# Patient Record
Sex: Female | Born: 1935 | Race: Black or African American | Hispanic: No | Marital: Married
Health system: Southern US, Community
[De-identification: ages and names within clinical notes are randomized; demographics above are authoritative.]

---

## 2004-03-23 ENCOUNTER — Other Ambulatory Visit: Payer: Self-pay

## 2005-08-20 ENCOUNTER — Emergency Department: Payer: Self-pay | Admitting: General Practice

## 2008-08-15 ENCOUNTER — Ambulatory Visit: Payer: Self-pay | Admitting: Internal Medicine

## 2008-09-11 ENCOUNTER — Ambulatory Visit: Payer: Self-pay | Admitting: Internal Medicine

## 2009-07-03 ENCOUNTER — Ambulatory Visit: Payer: Self-pay | Admitting: Gastroenterology

## 2009-08-21 ENCOUNTER — Inpatient Hospital Stay: Payer: Self-pay | Admitting: Internal Medicine

## 2009-09-01 ENCOUNTER — Ambulatory Visit: Payer: Self-pay | Admitting: Gastroenterology

## 2010-11-25 ENCOUNTER — Emergency Department: Payer: Self-pay | Admitting: Emergency Medicine

## 2011-01-01 ENCOUNTER — Ambulatory Visit: Payer: Self-pay | Admitting: Internal Medicine

## 2011-01-04 ENCOUNTER — Inpatient Hospital Stay: Payer: Self-pay | Admitting: Internal Medicine

## 2011-01-26 ENCOUNTER — Ambulatory Visit: Payer: Self-pay | Admitting: Internal Medicine

## 2011-01-31 ENCOUNTER — Ambulatory Visit: Payer: Self-pay | Admitting: Internal Medicine

## 2011-02-16 ENCOUNTER — Ambulatory Visit: Payer: Self-pay | Admitting: Internal Medicine

## 2011-03-03 ENCOUNTER — Ambulatory Visit: Payer: Self-pay | Admitting: Internal Medicine

## 2011-08-30 ENCOUNTER — Inpatient Hospital Stay: Payer: Self-pay | Admitting: Internal Medicine

## 2011-08-30 LAB — CK TOTAL AND CKMB (NOT AT ARMC)
CK, Total: 246 U/L — ABNORMAL HIGH (ref 21–215)
CK-MB: 6.1 ng/mL — ABNORMAL HIGH (ref 0.5–3.6)
CK-MB: 6.1 ng/mL — ABNORMAL HIGH (ref 0.5–3.6)

## 2011-08-30 LAB — TROPONIN I
Troponin-I: <0.02 ng/mL
Troponin-I: <0.02 ng/mL

## 2011-08-30 LAB — CBC
HCT: 37.8 % (ref 35.0–47.0)
HGB: 12.6 g/dL (ref 12.0–16.0)
MCV: 104 fL — ABNORMAL HIGH (ref 80–100)
Platelet: 225 10*3/uL (ref 150–440)

## 2011-08-30 LAB — COMPREHENSIVE METABOLIC PANEL
Alkaline Phosphatase: 61 U/L (ref 50–136)
Anion Gap: 9 (ref 7–16)
Bilirubin,Total: 0.6 mg/dL (ref 0.2–1.0)
Calcium, Total: 9.2 mg/dL (ref 8.5–10.1)
Chloride: 101 mmol/L (ref 98–107)
Co2: 30 mmol/L (ref 21–32)
Creatinine: 0.87 mg/dL (ref 0.60–1.30)
EGFR (Non-African Amer.): >60
Potassium: 3.9 mmol/L (ref 3.5–5.1)
SGOT(AST): 40 U/L — ABNORMAL HIGH (ref 15–37)
Total Protein: 6.8 g/dL (ref 6.4–8.2)

## 2011-08-30 LAB — RAPID INFLUENZA A&B ANTIGENS

## 2011-08-31 LAB — CBC WITH DIFFERENTIAL/PLATELET
Basophil #: 0 10*3/uL (ref 0.0–0.1)
Basophil %: 0 %
Eosinophil #: 0 10*3/uL (ref 0.0–0.7)
MCH: 34.9 pg — ABNORMAL HIGH (ref 26.0–34.0)
MCHC: 33.4 g/dL (ref 32.0–36.0)
MCV: 104 fL — ABNORMAL HIGH (ref 80–100)
Monocyte #: 0 10*3/uL (ref 0.0–0.7)
Neutrophil #: 10.3 10*3/uL — ABNORMAL HIGH (ref 1.4–6.5)
Neutrophil %: 95.3 %
RDW: 13.8 % (ref 11.5–14.5)
WBC: 10.8 10*3/uL (ref 3.6–11.0)

## 2011-08-31 LAB — TROPONIN I: Troponin-I: <0.02 ng/mL

## 2011-08-31 LAB — URINALYSIS, COMPLETE
Bacteria: NONE SEEN
Bilirubin,UR: NEGATIVE
Glucose,UR: 50 mg/dL (ref 0–75)
Ketone: NEGATIVE
Leukocyte Esterase: NEGATIVE
Nitrite: NEGATIVE
Ph: 5 (ref 4.5–8.0)
RBC,UR: 1 /HPF (ref 0–5)
Specific Gravity: 1.016 (ref 1.003–1.030)
Squamous Epithelial: NONE SEEN

## 2011-08-31 LAB — CK TOTAL AND CKMB (NOT AT ARMC): CK-MB: 6 ng/mL — ABNORMAL HIGH (ref 0.5–3.6)

## 2011-08-31 LAB — BASIC METABOLIC PANEL
BUN: 25 mg/dL — ABNORMAL HIGH (ref 7–18)
Calcium, Total: 8.3 mg/dL — ABNORMAL LOW (ref 8.5–10.1)
Co2: 25 mmol/L (ref 21–32)
EGFR (Non-African Amer.): >60
Glucose: 146 mg/dL — ABNORMAL HIGH (ref 65–99)
Potassium: 4.6 mmol/L (ref 3.5–5.1)

## 2011-09-04 LAB — CULTURE, BLOOD (SINGLE)

## 2011-09-04 LAB — CREATININE, SERUM
Creatinine: 0.94 mg/dL (ref 0.60–1.30)
EGFR (African American): >60
EGFR (Non-African Amer.): >60

## 2011-10-01 ENCOUNTER — Ambulatory Visit: Payer: Self-pay | Admitting: Internal Medicine

## 2011-10-17 ENCOUNTER — Inpatient Hospital Stay: Payer: Self-pay | Admitting: Internal Medicine

## 2011-10-17 LAB — URINALYSIS, COMPLETE
Bacteria: NONE SEEN
Ketone: NEGATIVE
Leukocyte Esterase: NEGATIVE
Ph: 5 (ref 4.5–8.0)
Protein: NEGATIVE
RBC,UR: 2 /HPF (ref 0–5)

## 2011-10-17 LAB — COMPREHENSIVE METABOLIC PANEL
Alkaline Phosphatase: 59 U/L (ref 50–136)
Anion Gap: 6 — ABNORMAL LOW (ref 7–16)
BUN: 29 mg/dL — ABNORMAL HIGH (ref 7–18)
Bilirubin,Total: 0.4 mg/dL (ref 0.2–1.0)
Chloride: 100 mmol/L (ref 98–107)
Creatinine: 0.78 mg/dL (ref 0.60–1.30)
EGFR (Non-African Amer.): >60
Potassium: 4.6 mmol/L (ref 3.5–5.1)
SGPT (ALT): 26 U/L
Total Protein: 7 g/dL (ref 6.4–8.2)

## 2011-10-17 LAB — PROTIME-INR
INR: 0.9
Prothrombin Time: 12.6 secs (ref 11.5–14.7)

## 2011-10-17 LAB — CBC WITH DIFFERENTIAL/PLATELET
Eosinophil #: 0 10*3/uL (ref 0.0–0.7)
Eosinophil %: 0.1 %
HGB: 11.6 g/dL — ABNORMAL LOW (ref 12.0–16.0)
Lymphocyte %: 12 %
MCH: 34.6 pg — ABNORMAL HIGH (ref 26.0–34.0)
MCHC: 33.7 g/dL (ref 32.0–36.0)
Monocyte #: 0.8 10*3/uL — ABNORMAL HIGH (ref 0.0–0.7)
Neutrophil %: 80.9 %
Platelet: 218 10*3/uL (ref 150–440)
RBC: 3.36 10*6/uL — ABNORMAL LOW (ref 3.80–5.20)
WBC: 11.8 10*3/uL — ABNORMAL HIGH (ref 3.6–11.0)

## 2011-10-17 LAB — CK TOTAL AND CKMB (NOT AT ARMC)
CK, Total: 58 U/L (ref 21–215)
CK-MB: 2.7 ng/mL (ref 0.5–3.6)

## 2011-10-19 LAB — CBC WITH DIFFERENTIAL/PLATELET
Basophil #: 0 10*3/uL (ref 0.0–0.1)
Eosinophil #: 0 10*3/uL (ref 0.0–0.7)
HGB: 10.5 g/dL — ABNORMAL LOW (ref 12.0–16.0)
Lymphocyte #: 0.9 10*3/uL — ABNORMAL LOW (ref 1.0–3.6)
Lymphocyte %: 6.5 %
MCHC: 33.4 g/dL (ref 32.0–36.0)
Monocyte %: 4.8 %
Neutrophil %: 88.6 %
RDW: 13.9 % (ref 11.5–14.5)
WBC: 13.4 10*3/uL — ABNORMAL HIGH (ref 3.6–11.0)

## 2011-11-01 ENCOUNTER — Ambulatory Visit: Payer: Self-pay | Admitting: Internal Medicine

## 2012-02-22 ENCOUNTER — Ambulatory Visit: Payer: Self-pay | Admitting: Vascular Surgery

## 2012-02-22 LAB — CREATININE, SERUM
Creatinine: 0.79 mg/dL (ref 0.60–1.30)
EGFR (African American): >60
EGFR (Non-African Amer.): >60

## 2012-02-22 LAB — BUN: BUN: 18 mg/dL (ref 7–18)

## 2012-05-08 IMAGING — CT CT CHEST W/ CM
1 of 2 series · 14 of 32 positions shown, 18 images · IV contrast (APPLIED)
Comparison: none

REASON FOR EXAM: sob, hypoxia, Ephrim
COMMENTS:

PROCEDURE:     CT  - CT CHEST (FOR PE) W  - January 07, 2011  [DATE]
RESULT:     Comparison: CT of the chest, abdomen, and pelvis 08/21/2009
TECHNIQUE: Multiple thin section axial images were obtained from the lung
apices to the upper abdomen following 75 ml Isovue 370 intravenous contrast,
according to the PE protocol. These images were also reviewed on a Siemens
multiplanar work station.

[Series 5: lung windows · axial · 0.60mm/px · z∈[+731,+1010]mm · 14 of 111 slices shown, 18 images]
[im 9/111  mediastinal]
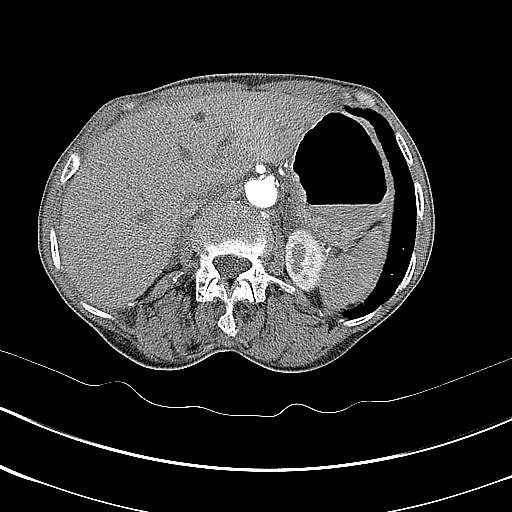
[im 9/111  lung]
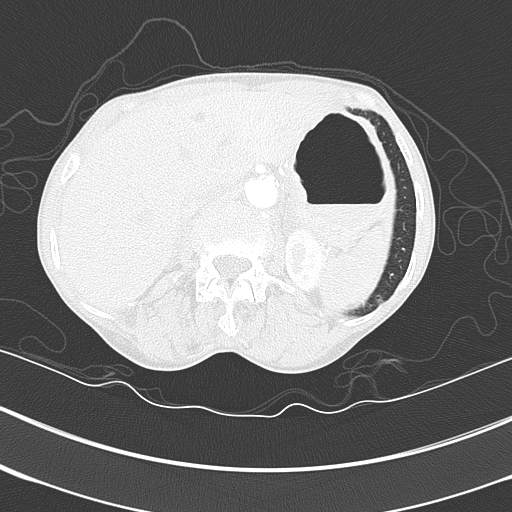
[im 17/111  lung]
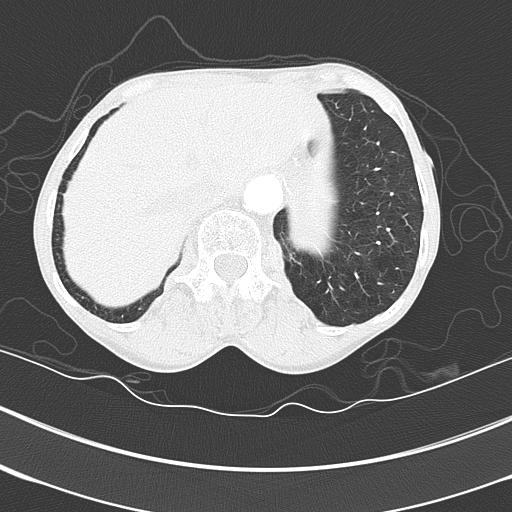
[im 26/111  lung]
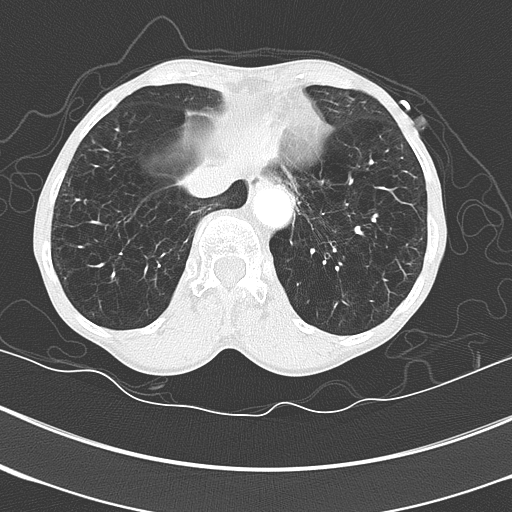
[im 34/111  lung]
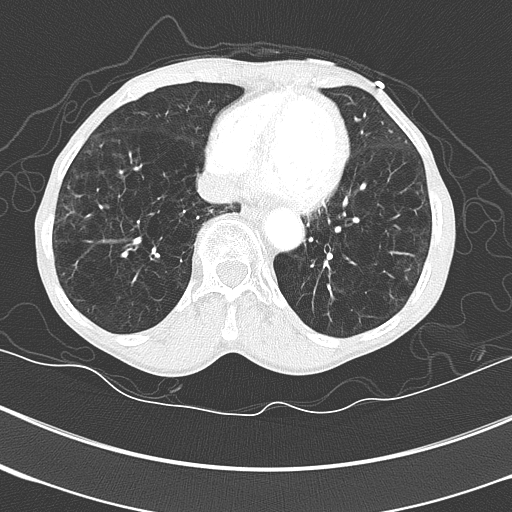
[im 43/111  mediastinal]
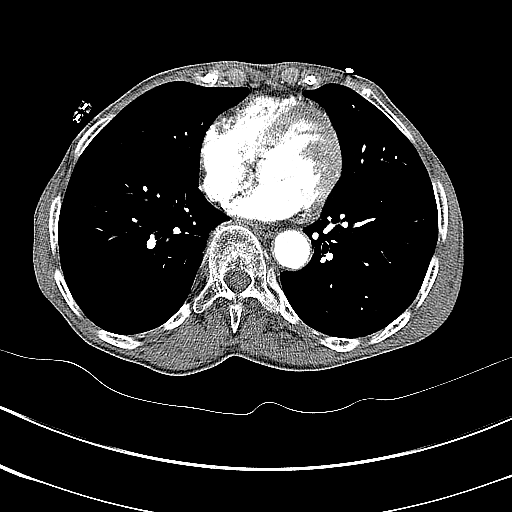
[im 43/111  lung]
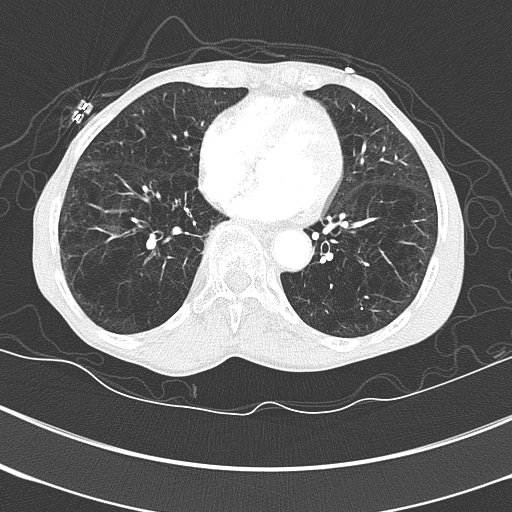
[im 51/111  lung]
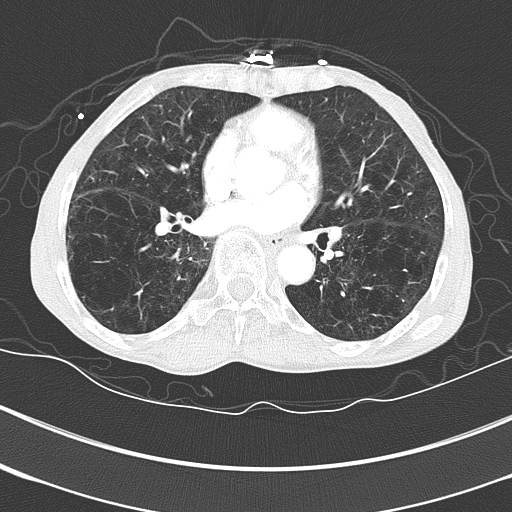
[im 52/111  lung]
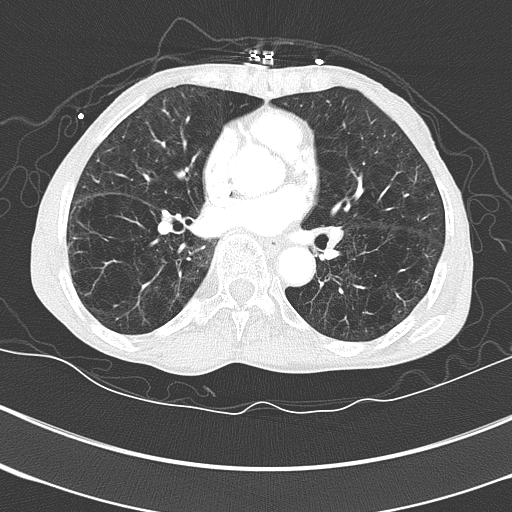
[im 56/111  lung]
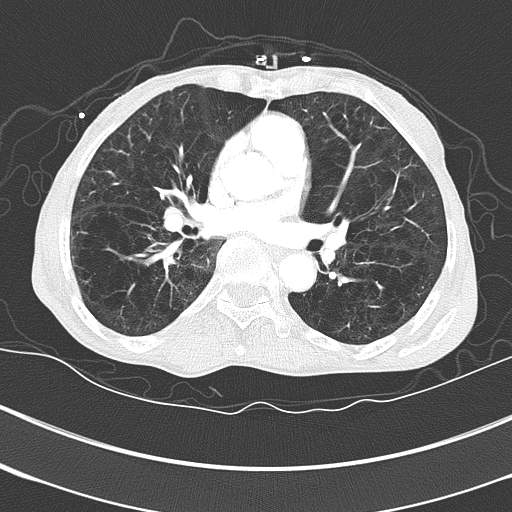
[im 60/111  mediastinal]
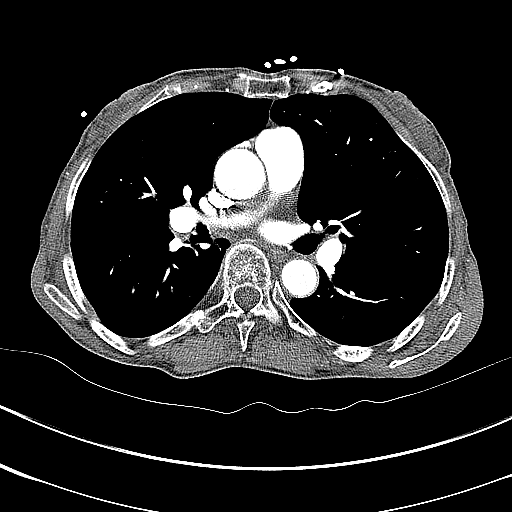
[im 60/111  lung]
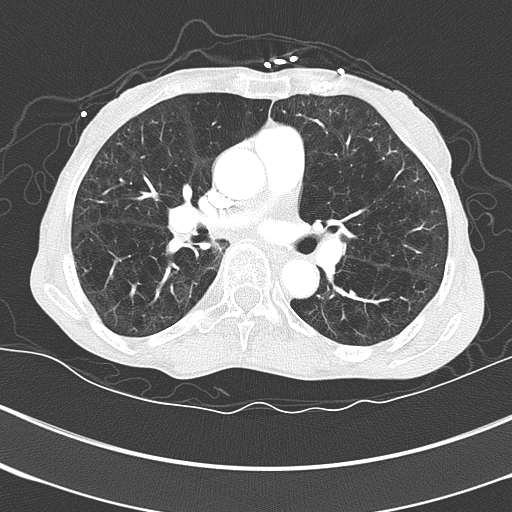
[im 68/111  lung]
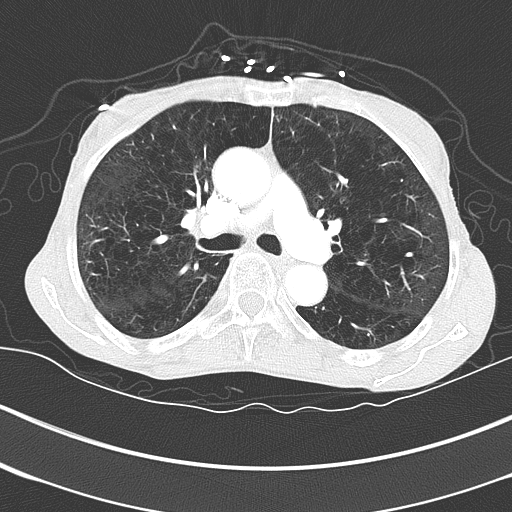
[im 77/111  lung]
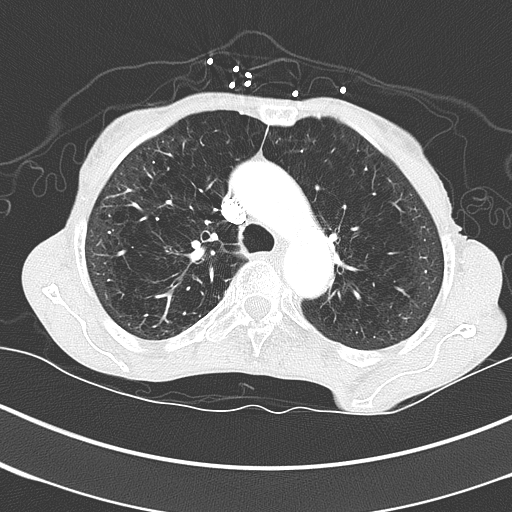
[im 85/111  lung]
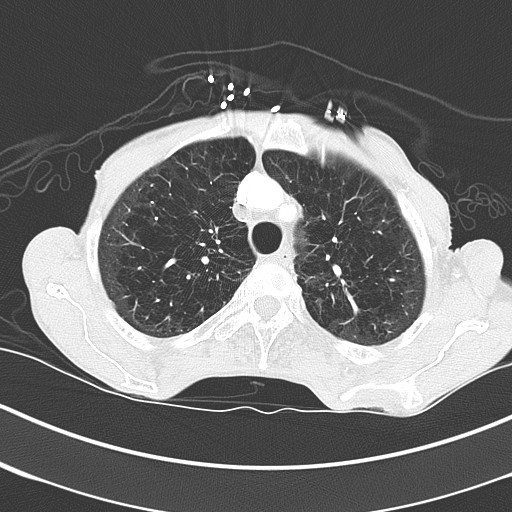
[im 94/111  mediastinal]
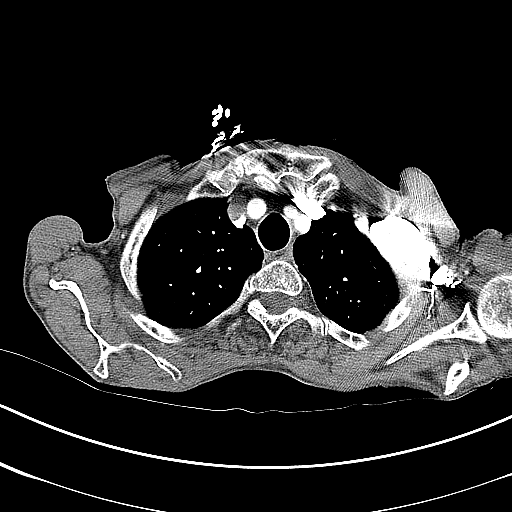
[im 94/111  lung]
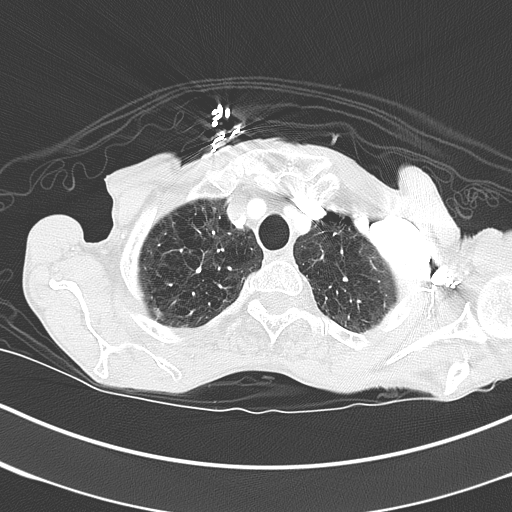
[im 102/111  lung]
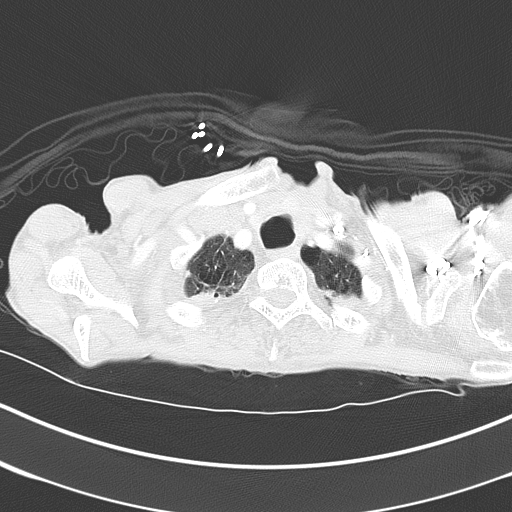

[14 of 32 positions shown; findings below may reference images not displayed]

FINDINGS: No mediastinal, hilar, or axillary lymphadenopathy. The ascending thoracic
aorta is somewhat ectatic. Small low-attenuation lesions in the liver are
too small to characterize, but likely represents cysts. These are similar to
prior. No pulmonary embolus identified.

There is moderate centrilobular emphysema. There is a nodule in the left
upper lobe which measures 1.3 x 0.8 cm. This is concerning for malignancy.
The lungs are hyperinflated.

No aggressive lytic or sclerotic osseous lesions identified.
IMPRESSION: 1. No pulmonary embolus identified.
2. Spiculated left upper lobe nodule, concerning for malignancy. Oncology
consultation is recommended.
3. Emphysema.

## 2012-09-02 ENCOUNTER — Ambulatory Visit: Payer: Self-pay | Admitting: Internal Medicine

## 2012-09-17 ENCOUNTER — Inpatient Hospital Stay: Payer: Self-pay | Admitting: Internal Medicine

## 2012-09-17 LAB — CBC WITH DIFFERENTIAL/PLATELET
Basophil #: 0.1 10*3/uL (ref 0.0–0.1)
Basophil %: 0.5 %
Eosinophil #: 0 10*3/uL (ref 0.0–0.7)
HCT: 31.9 % — ABNORMAL LOW (ref 35.0–47.0)
Lymphocyte #: 0.8 10*3/uL — ABNORMAL LOW (ref 1.0–3.6)
Lymphocyte %: 5.4 %
MCH: 32.5 pg (ref 26.0–34.0)
MCHC: 32.3 g/dL (ref 32.0–36.0)
Monocyte #: 0.7 x10 3/mm (ref 0.2–0.9)
Monocyte %: 4.7 %
Neutrophil #: 12.9 10*3/uL — ABNORMAL HIGH (ref 1.4–6.5)
Platelet: 268 10*3/uL (ref 150–440)
RBC: 3.17 10*6/uL — ABNORMAL LOW (ref 3.80–5.20)
RDW: 15.1 % — ABNORMAL HIGH (ref 11.5–14.5)

## 2012-09-17 LAB — BASIC METABOLIC PANEL
Anion Gap: 5 — ABNORMAL LOW (ref 7–16)
BUN: 27 mg/dL — ABNORMAL HIGH (ref 7–18)
Calcium, Total: 8.7 mg/dL (ref 8.5–10.1)
Chloride: 105 mmol/L (ref 98–107)
EGFR (Non-African Amer.): 47 — ABNORMAL LOW
Glucose: 129 mg/dL — ABNORMAL HIGH (ref 65–99)
Osmolality: 284 (ref 275–301)

## 2012-09-18 LAB — CBC WITH DIFFERENTIAL/PLATELET
Basophil #: 0 10*3/uL (ref 0.0–0.1)
Basophil %: 0.1 %
Eosinophil #: 0 10*3/uL (ref 0.0–0.7)
Eosinophil %: 0 %
HGB: 10.7 g/dL — ABNORMAL LOW (ref 12.0–16.0)
Lymphocyte #: 0.9 10*3/uL — ABNORMAL LOW (ref 1.0–3.6)
Lymphocyte %: 3.5 %
MCH: 32.1 pg (ref 26.0–34.0)
MCHC: 32.2 g/dL (ref 32.0–36.0)
MCV: 100 fL (ref 80–100)
Monocyte #: 0.7 x10 3/mm (ref 0.2–0.9)
Neutrophil #: 25.5 10*3/uL — ABNORMAL HIGH (ref 1.4–6.5)
WBC: 27.1 10*3/uL — ABNORMAL HIGH (ref 3.6–11.0)

## 2012-09-18 LAB — BASIC METABOLIC PANEL
BUN: 27 mg/dL — ABNORMAL HIGH (ref 7–18)
Calcium, Total: 9.4 mg/dL (ref 8.5–10.1)
Chloride: 101 mmol/L (ref 98–107)
Co2: 26 mmol/L (ref 21–32)
Creatinine: 1.14 mg/dL (ref 0.60–1.30)
EGFR (African American): 52 — ABNORMAL LOW
Glucose: 136 mg/dL — ABNORMAL HIGH (ref 65–99)
Osmolality: 283 (ref 275–301)
Potassium: 4.2 mmol/L (ref 3.5–5.1)

## 2012-09-19 LAB — CBC WITH DIFFERENTIAL/PLATELET
Basophil %: 0.1 %
Eosinophil #: 0 10*3/uL (ref 0.0–0.7)
HGB: 10.7 g/dL — ABNORMAL LOW (ref 12.0–16.0)
Lymphocyte #: 0.5 10*3/uL — ABNORMAL LOW (ref 1.0–3.6)
Lymphocyte %: 2.4 %
MCH: 32.9 pg (ref 26.0–34.0)
MCHC: 32.9 g/dL (ref 32.0–36.0)
Neutrophil #: 19.8 10*3/uL — ABNORMAL HIGH (ref 1.4–6.5)
Platelet: 268 10*3/uL (ref 150–440)
RBC: 3.25 10*6/uL — ABNORMAL LOW (ref 3.80–5.20)
RDW: 15 % — ABNORMAL HIGH (ref 11.5–14.5)
WBC: 21 10*3/uL — ABNORMAL HIGH (ref 3.6–11.0)

## 2012-09-19 LAB — BASIC METABOLIC PANEL
Anion Gap: 7 (ref 7–16)
Calcium, Total: 9.1 mg/dL (ref 8.5–10.1)
Chloride: 102 mmol/L (ref 98–107)
Creatinine: 1.53 mg/dL — ABNORMAL HIGH (ref 0.60–1.30)
Glucose: 154 mg/dL — ABNORMAL HIGH (ref 65–99)
Potassium: 4 mmol/L (ref 3.5–5.1)
Sodium: 138 mmol/L (ref 136–145)

## 2012-09-20 LAB — CBC WITH DIFFERENTIAL/PLATELET
Eosinophil #: 0 10*3/uL (ref 0.0–0.7)
HGB: 10.6 g/dL — ABNORMAL LOW (ref 12.0–16.0)
Lymphocyte #: 0.6 10*3/uL — ABNORMAL LOW (ref 1.0–3.6)
Lymphocyte %: 4.6 %
MCH: 33.4 pg (ref 26.0–34.0)
MCHC: 33.2 g/dL (ref 32.0–36.0)
MCV: 101 fL — ABNORMAL HIGH (ref 80–100)
Monocyte %: 4.1 %
Platelet: 268 10*3/uL (ref 150–440)
RBC: 3.17 10*6/uL — ABNORMAL LOW (ref 3.80–5.20)
RDW: 15.4 % — ABNORMAL HIGH (ref 11.5–14.5)
WBC: 12.6 10*3/uL — ABNORMAL HIGH (ref 3.6–11.0)

## 2012-09-20 LAB — BASIC METABOLIC PANEL
Anion Gap: 7 (ref 7–16)
BUN: 48 mg/dL — ABNORMAL HIGH (ref 7–18)
Chloride: 105 mmol/L (ref 98–107)
Co2: 29 mmol/L (ref 21–32)
Creatinine: 1.38 mg/dL — ABNORMAL HIGH (ref 0.60–1.30)
EGFR (African American): 41 — ABNORMAL LOW
Glucose: 170 mg/dL — ABNORMAL HIGH (ref 65–99)
Osmolality: 298 (ref 275–301)
Potassium: 4.6 mmol/L (ref 3.5–5.1)
Sodium: 141 mmol/L (ref 136–145)

## 2012-09-21 LAB — BASIC METABOLIC PANEL
Anion Gap: 8 (ref 7–16)
Calcium, Total: 9 mg/dL (ref 8.5–10.1)
Chloride: 106 mmol/L (ref 98–107)
Co2: 27 mmol/L (ref 21–32)
EGFR (Non-African Amer.): 43 — ABNORMAL LOW
Glucose: 176 mg/dL — ABNORMAL HIGH (ref 65–99)
Osmolality: 297 (ref 275–301)

## 2012-09-21 LAB — CBC WITH DIFFERENTIAL/PLATELET
Bands: 1 %
HCT: 30 % — ABNORMAL LOW (ref 35.0–47.0)
HGB: 9.6 g/dL — ABNORMAL LOW (ref 12.0–16.0)
Lymphocytes: 6 %
MCH: 32.2 pg (ref 26.0–34.0)
MCHC: 32.2 g/dL (ref 32.0–36.0)
Metamyelocyte: 1 %
Myelocyte: 1 %
Platelet: 259 10*3/uL (ref 150–440)
RDW: 15.1 % — ABNORMAL HIGH (ref 11.5–14.5)
Segmented Neutrophils: 87 %

## 2012-09-21 LAB — URINALYSIS, COMPLETE
Glucose,UR: NEGATIVE mg/dL (ref 0–75)
Leukocyte Esterase: NEGATIVE
Nitrite: NEGATIVE
Ph: 5 (ref 4.5–8.0)
Squamous Epithelial: 1

## 2012-09-22 LAB — CULTURE, BLOOD (SINGLE)

## 2012-09-25 LAB — CBC WITH DIFFERENTIAL/PLATELET
Bands: 3 %
Lymphocytes: 10 %
MCH: 33.2 pg (ref 26.0–34.0)
MCHC: 33.2 g/dL (ref 32.0–36.0)
MCV: 100 fL (ref 80–100)
Metamyelocyte: 2 %
Monocytes: 5 %
Myelocyte: 2 %
Platelet: 298 10*3/uL (ref 150–440)
RDW: 15.2 % — ABNORMAL HIGH (ref 11.5–14.5)
Variant Lymphocyte - H1-Rlymph: 1 %
WBC: 12.4 10*3/uL — ABNORMAL HIGH (ref 3.6–11.0)

## 2012-09-25 LAB — BASIC METABOLIC PANEL
Anion Gap: 3 — ABNORMAL LOW (ref 7–16)
BUN: 23 mg/dL — ABNORMAL HIGH (ref 7–18)
Calcium, Total: 8.9 mg/dL (ref 8.5–10.1)
Chloride: 104 mmol/L (ref 98–107)
Co2: 32 mmol/L (ref 21–32)
Glucose: 95 mg/dL (ref 65–99)

## 2012-09-30 ENCOUNTER — Ambulatory Visit: Payer: Self-pay | Admitting: Internal Medicine

## 2012-10-08 ENCOUNTER — Other Ambulatory Visit: Payer: Self-pay | Admitting: Family Medicine

## 2012-10-08 LAB — URINALYSIS, COMPLETE
Ph: 8 (ref 4.5–8.0)
Protein: NEGATIVE
RBC,UR: 11 /HPF (ref 0–5)
Specific Gravity: 1.018 (ref 1.003–1.030)
Squamous Epithelial: 1
WBC UR: 4 /HPF (ref 0–5)

## 2012-10-10 LAB — URINE CULTURE

## 2012-12-29 IMAGING — CT CT CHEST W/ CM
1 series · 15 of 33 positions shown, 19 images · IV contrast (APPLIED)
Comparison: none

REASON FOR EXAM: shortness of breath, probable lung ca with known MAYTE
mass, ?PE
COMMENTS:

PROCEDURE:     CT  - CT CHEST (FOR PE) W  - August 30, 2011  [DATE]
RESULT:     Chest CT dated 08/30/2011 comparison a made to a prior study
dated 01/07/2011
TECHNIQUE: Helical 3 mm sections were obtained the thoracic inlet the lung
bases status post intravenous administration of 80 mL Tsovue-LD5.

[Series 4: soft tissue · axial · 0.60mm/px · z∈[+60,+348]mm · 15 of 114 slices shown, 19 images]
[im 9/114  mediastinal]
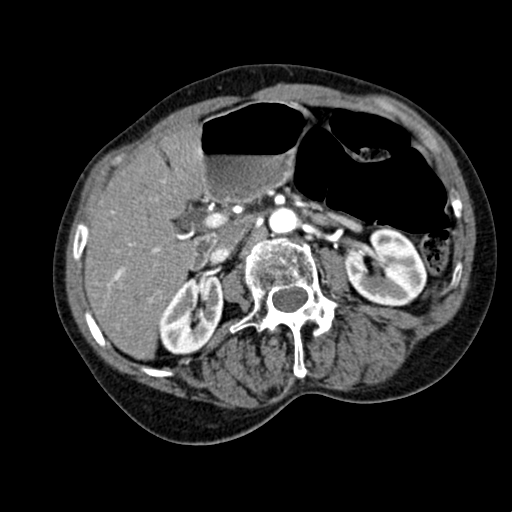
[im 9/114  lung]
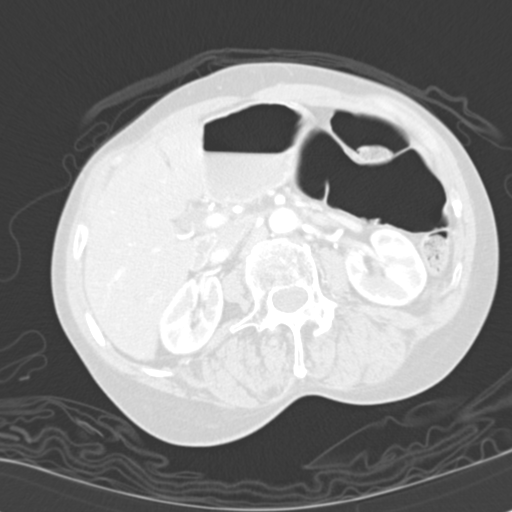
[im 17/114  lung]
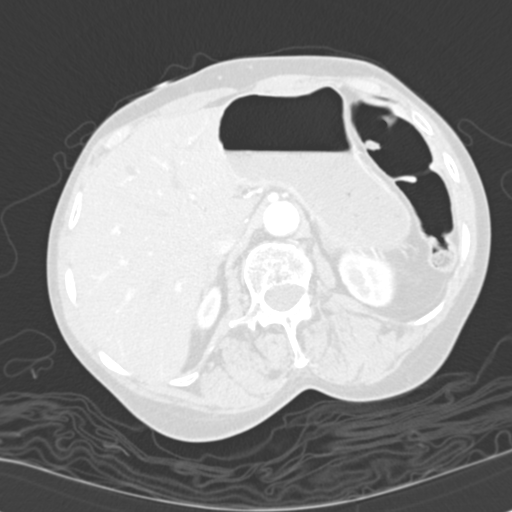
[im 23/114  lung]
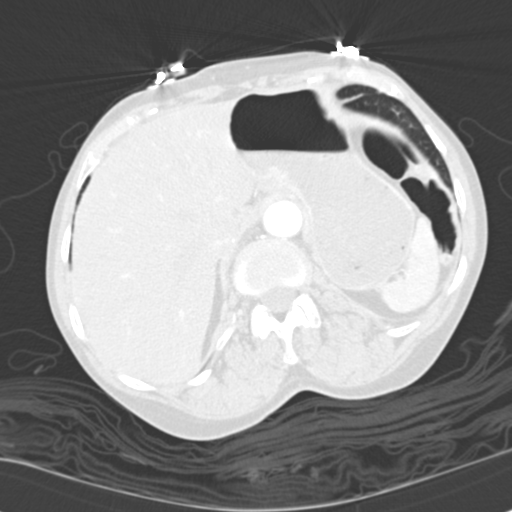
[im 30/114  lung]
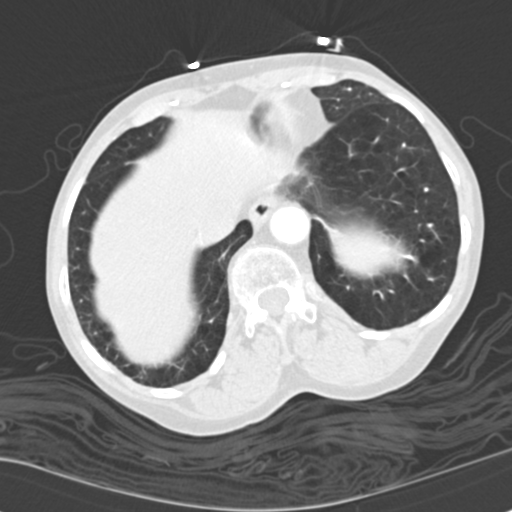
[im 38/114  mediastinal]
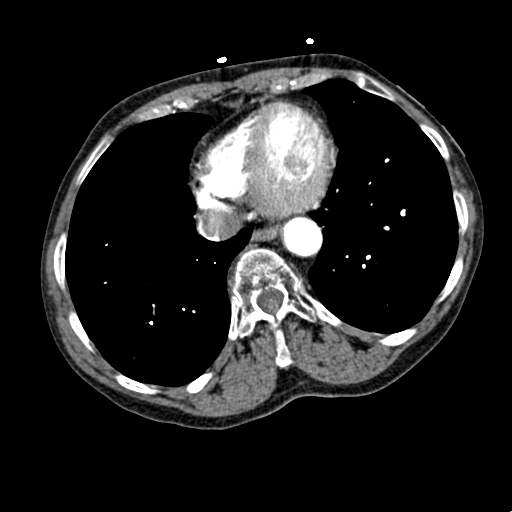
[im 38/114  lung]
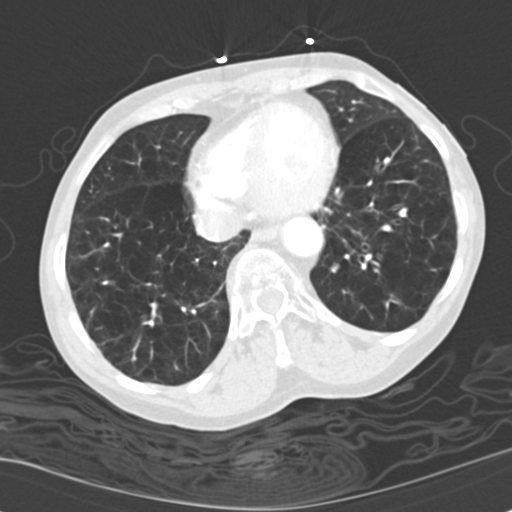
[im 46/114  lung]
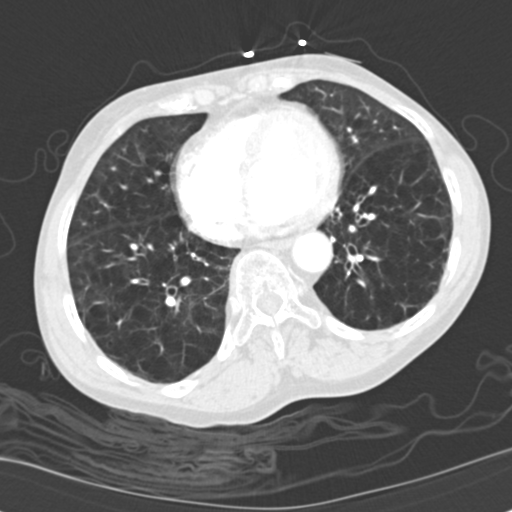
[im 51/114  lung]
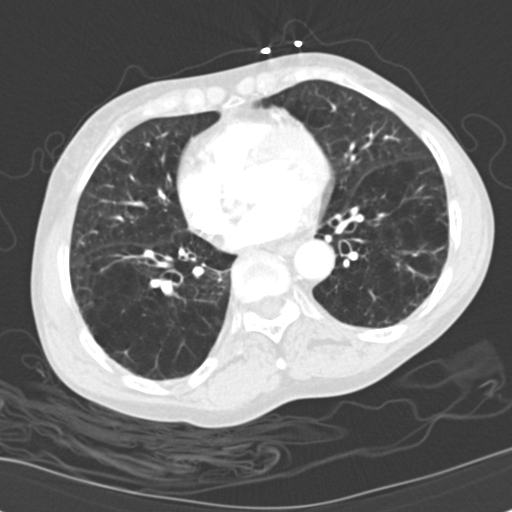
[im 59/114  lung]
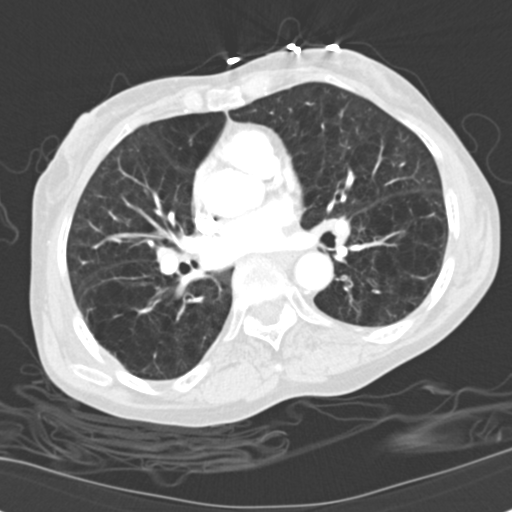
[im 63/114  mediastinal]
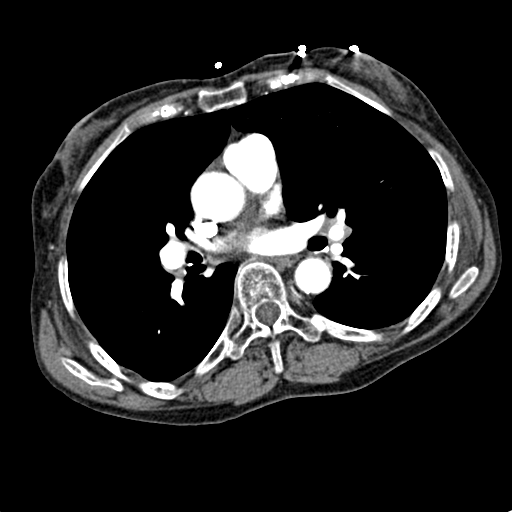
[im 63/114  lung]
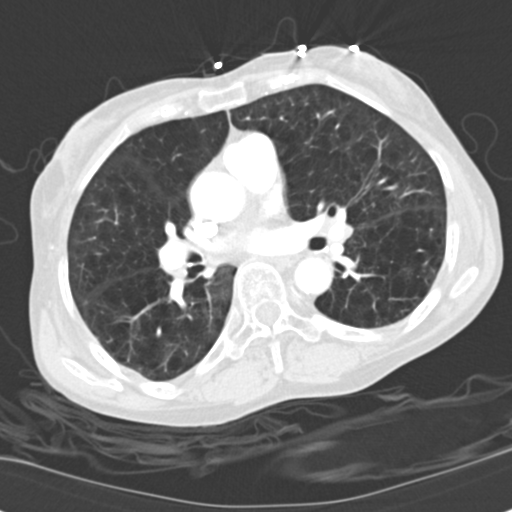
[im 68/114  lung]
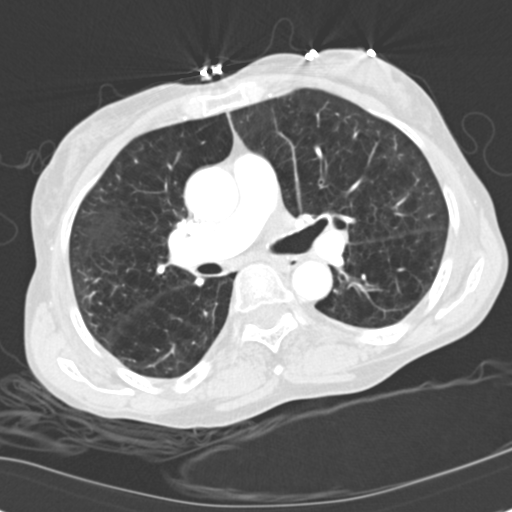
[im 76/114  lung]
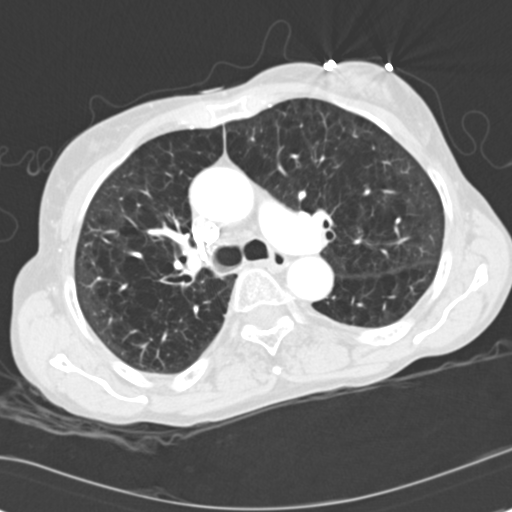
[im 84/114  lung]
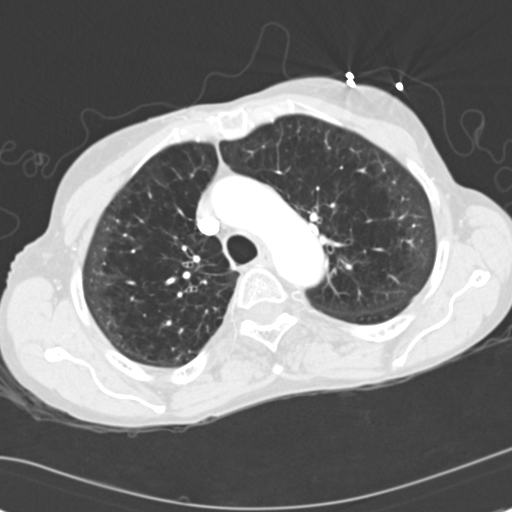
[im 91/114  mediastinal]
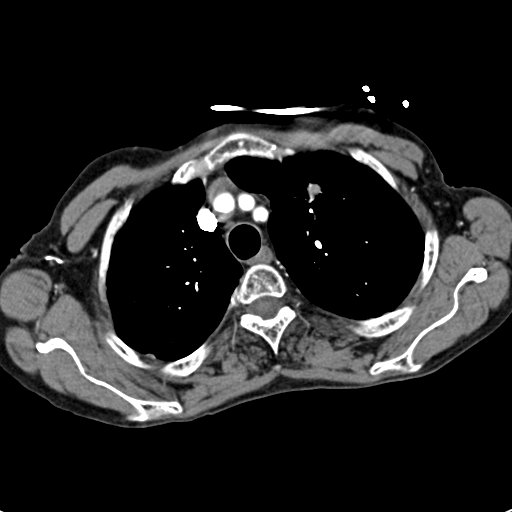
[im 91/114  lung]
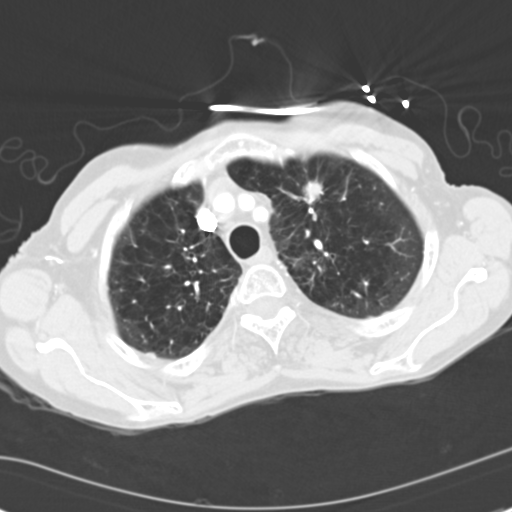
[im 97/114  lung]
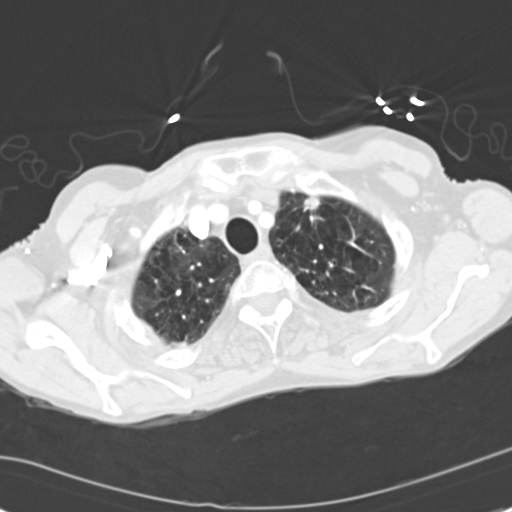
[im 105/114  lung]
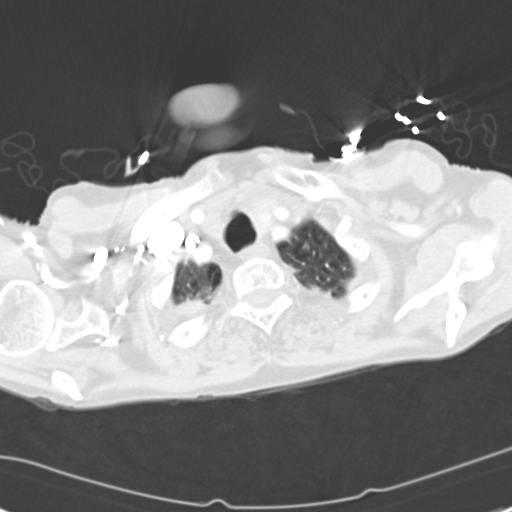

[15 of 33 positions shown; findings below may reference images not displayed]

FINDINGS: Bases of spiculated nodule within the anterior apex of the left
upper lobe has increased in size measuring 2.4 x 1.1 cm in maximal
orthogonal dimensions. This finding is again worrisome for neoplastic
disease. Diffuse severe emphysematous changes are appreciated throughout
both lungs. There is an underlying component of mild fibrotic changes.

The mediastinum and hilar regions and structures demonstrate no evidence of
mediastinal nor hilar adenopathy nor masses. There is no evidence of filling
defects within the main, lobar, or segmental only arteries.

The visualized upper abdominal viscera demonstrate no gross abnormalities.
IMPRESSION: Increased size of the left upper lobe spiculated nodule
concerning or neoplastic disease. Oncology consultation once again
recommended if not obtained in the past.
2. No CT evidence of pulmonary arterial embolic disease.
3. Severe emphysematous changes.

## 2013-11-30 DEATH — deceased

## 2014-11-19 NOTE — Op Note (Signed)
PATIENT NAME:  Crystal Sanders, Carrah M MR#:  [id number] DATE OF BIRTH:  1928-08-10  DATE OF PROCEDURE:  02/22/2012  PREOPERATIVE DIAGNOSIS: Atherosclerotic occlusive disease bilateral lower extremities with ulcerations right foot.   POSTOPERATIVE DIAGNOSIS: Atherosclerotic occlusive disease bilateral lower extremities with ulceration right foot.   PROCEDURES PERFORMED:  1. Abdominal aortogram.  2. Right lower extremity distal runoff, third order catheter placement.  3. Percutaneous transluminal angioplasty to 2 mm right posterior tibial artery.  4. Percutaneous transluminal angioplasty with stent placement to 4 mm, right SFA.  5. Percutaneous transluminal angioplasty and stent placement right external iliac artery to 6 mm.   PROCEDURE PERFORMED BY: Renford DillsGregory G. Schnier, MD    SEDATION: Versed 4 mg plus fentanyl 150 mcg administered IV. Continuous ECG, pulse oximetry, and cardiopulmonary monitoring was performed throughout the entire procedure by the interventional radiology nurse. Total sedation time was 1 hour, 50 minutes.   ACCESS: 6 French sheath left common femoral artery.   FLUORO TIME: 22.4 minutes.   CONTRAST USED: Isovue 95 mL.   INDICATIONS: Ms. Crystal Sanders is an 81 year old woman who presented to the office with ulcerations of the right foot. Physical examination as well as noninvasive studies demonstrated significant atherosclerotic occlusive disease and then arrangements were made for angiography with the hope for intervention for limb salvage. The risks and benefits have been reviewed with the patient as well as the patient's daughter. All questions were answered. The patient agrees to proceed.   PROCEDURE: The patient is taken to Special Procedures and placed in the supine position. After adequate sedation is achieved, both groins are prepped and draped in a sterile fashion. Ultrasound is placed in a sterile sleeve. Ultrasound is utilized secondary to lack of appropriate landmarks. Under  direct ultrasound visualization, the common femoral artery is identified. It is echolucent and pulsatile indicating patency. Image is recorded for the permanent record. With real-time visualization, microneedle was inserted into the anterior wall of the common femoral artery, microwire followed by micro sheath, J-wire followed by 5 French sheath, 5 French pigtail catheter. Pigtail catheter is positioned at the level of T12 and AP projection of the aorta is obtained. Pigtail catheter is then repositioned and bilateral oblique views of the pelvis are obtained. A stiff angled Glidewire and pigtail catheter are then used to cross the aortic bifurcation and the catheter wire combination is negotiated into the proximal SFA. Distal runoff is obtained. This represents third order catheter placement.   Using a combination of a straight glide catheter and the Glidewire, the multiple subtotal occlusive lesions are crossed. At one point the catheter would not cross and a 3 x 6 mm balloon was used to pre-dilate the SFA. This allowed the catheter to track down into the popliteal where hand injection of contrast was again utilized to demonstrate distal runoff. Ultimately, single vessel runoff via the posterior tibial is noted. There is a high-grade stricture stenosis at the origin. There is a distal lesion near the ankle. Ultimately the catheter using a combination of different wires including Platinum Plus, grand slam V18, and several different catheters including the straight glide catheter as well as CXI catheter. The distal lesion was not able to be crossed, however, the proximal lesion was and this was treated with balloon dilatation to 10 atmospheres using a 2 mm diameter balloon. Attention was then turned to the SFA where 4 mm diameter balloon was utilized to dilate the SFA. Follow-up imaging demonstrated greater than 75% lesion at Hunter's canal and, therefore, a 6 x  170 LifeStent was deployed across this area and then  post dilated with the 4 mm balloon. Follow-up angiography demonstrated excellent result with preservation of single vessel runoff. The LAO projection of the external iliac was then obtained, the previously noted lesion localized. Diameter of the iliac artery was measured to be approximately 6 mm and, therefore, a 7 x 40 Life Star stent was deployed across this lesion and postdilated to 6 mm. Follow-up angiography demonstrated an excellent result with rapid flow of contrast into the femoral system. Sheath was then pulled, iliac groin image obtained in the oblique projection and a Mynx device deployed without difficulty. There were no immediate complications.   INTERPRETATION: Initial views of the aorta demonstrate the aorta is widely patent. There is a high-grade stenosis approximately 70% in the right external iliac lesion. Common femoral and profunda femoris are patent. Superficial femoral artery is patent in its proximal one-third but then demonstrates multiple subtotal occlusions throughout its course down and into Hunter's canal. Mid and distal popliteal is widely patent. Trifurcation has heavy disease with only single vessel runoff via the posterior tibial. This demonstrates a greater than 90% stenosis at its origin. There is also approximately a 60 to 70% stenosis down by the ankle. This lesion down by the ankle was not able to be crossed but the proximal lesion was and this was treated with 2 mm inflation with an excellent result and less than 5% residual stenosis. The SFA lesion was treated to 4 mm but did require stent secondary to a significant flow-limiting dissection. Iliac lesion was stented and postdilated to 6 mm.   SUMMARY: Successful recanalization for limb salvage right lower extremity multilevel disease including the external iliac, the SFA, and the tibials.   ____________________________ Renford Dills, MD ggs:drc D: 02/22/2012 10:03:55 ET T: 02/22/2012 11:15:35  ET JOB#: [id number]  cc: Renford Dills, MD, <Dictator> Renford Dills MD ELECTRONICALLY SIGNED 02/24/2012 12:26

## 2014-11-22 NOTE — Consult Note (Signed)
   Comments   Ginny Ward, hospice liaison, and I met with pt's 2 daughter (including pt's 55), son, daughter-in-law, and son-in-law regarding pt's medical care. They confirm that patient has been a resident of [facility] with hospice following. Overall pt's medical status has been stable until rapid decline over past several weeks with increasing shortness of breath requiring frequent comfort meds. Family are in agreement with current medical plan including IV abx and want to give patient time to see if her clinical status improves. Overall they recognize that pt has been declining and may be approaching end of life (with or without treatment). We talked about option of the Hospice Home if it becomes apparent that patient is not improving and they would likely agree with this. They do NOT want pt to return to [facility] and ask that she be found an alternative placement. Will ask CM to assist.  add po morphine as this has been efficacious at SNF. Pt has been requiring dosing about every 2 hours. Will follow.  30 minutes  Electronic Signatures: Josuha Fontanez, Kirt Boys (NP)  (Signed 17-Feb-14 11:16)  Authored: Palliative Care Phifer, Izora Gala (MD)  (Signed 17-Feb-14 20:58)  Authored: Palliative Care   Last Updated: 17-Feb-14 20:58 by Phifer, Izora Gala (MD)

## 2014-11-22 NOTE — Discharge Summary (Signed)
PATIENT NAME:  Crystal Sanders, Crystal Sanders MR#:  [id number] DATE OF BIRTH:  1928-08-19  DATE OF ADMISSION:  09/17/2012 DATE OF DISCHARGE:  09/25/2012  PRIMARY CARE PHYSICIAN: Toy Cookey, MD  FINAL DIAGNOSES: 1.  Acute on chronic respiratory failure.  2.  Systemic inflammatory response syndrome.  3.  Thrush.  4.  Chronic systolic congestive heart failure.  5.  Acute kidney injury 6.  Left upper lobe mass, likely lung cancer.  7.  Constipation.  8.  Failure to thrive.   DISCHARGE MEDICATIONS: 1.  Advair Diskus 250/50 one inhalation twice a day. 2.  Protonix 40 mg daily. 3.  Aspirin 81 mg daily. 4.  Remeron 7.5 mg at bedtime. 5.  Coreg 3.125 mg twice a day. 6.  Roflumilast 500 mcg daily.  7.  DuoNeb 3 mL inhalation nebulizer 4 times daily.  8.  Senna S 50/8.6 two tablets at bedtime. 9.  MiraLax 17 grams daily. 10.   Latanoprost 0.005% ophthalmic solution 1 drop affected eye at bedtime.  11.  Tylenol 650 mg every 6 hours as needed. 12.  Morphine 20 mg/mL 10 mg orally every 2 hours as needed for pain or shortness of breath. 13.  Xanax 0.25 mg every 4 hours as needed for anxiety.  14.  Decadron 2 mg daily. 15.  Fluconazole 100 mg daily for 7 days then stop. 16.  Levaquin 500 mg for 2 more days then stop. 17.  Ensure 240 mL 3 times a day.  OXYGEN:  2 liters nasal cannula.   DIET: Low sodium diet, regular consistency.   ACTIVITY: As tolerated.  DISCHARGE INSTRUCTIONS:  Follow up in 1 to 2 days with doctor at nursing home facility.   REASON FOR ADMISSION: The patient was admitted February 16th. Came in with shortness of breath, hypoxia, fever and cough. The patient was admitted initially for possibility of healthcare-associated pneumonia, postobstructive pneumonia, was started on Zosyn, Levaquin and vancomycin, and Solu-Medrol.   LABORATORY AND RADIOLOGICAL DATA DURING HOSPITAL COURSE: Included a normal sinus rhythm, incomplete left bundle branch block. Blood cultures were negative. Influenza  A and B were negative. Chest x-ray was read as increased size of the patient's known left upper lobe spiculated nodule. No evidence of acute cardiopulmonary disease. White count 14.5, H and H 10.3 and 31.9, and platelet count of 268. Glucose 129, BUN 27, creatinine 1.1, sodium 139, potassium 4.8, chloride 105, CO2 27, calcium 8.7. Creatinine did go up to 1.53. Vitamin B12 greater than 1000. Repeat chest x-ray on the 20th showed left upper lobe mass. Upon discharge labs: White blood cell count 12.4, H and H 10.7 and 32.3, and platelet count of 298. Glucose 95, BUN 23, creatinine 0.65, sodium 139, potassium 4.1, chloride 104 and CO2 32.   HOSPITAL COURSE PER PROBLEM LIST:  1.  For the patient's acute on chronic respiratory failure, I think this is secondary to COPD exacerbation. The chest x-ray did not show a pneumonia. The patient was initially started on aggressive antibiotics and cut back to Levaquin. Her vital signs are stable. She will complete a course of Levaquin, 2 more days. She is chronically on Decadron. She does have COPD and on inhalers and nebulizers.  2.  For her systemic inflammatory response syndrome, that had improved. The vital signs are better. Upon discharge temperature 97.8, pulse 85, pulse oximetry 100% on 2 liters and respirations 20. 3.  For her thrush, this may be the cause of her poor appetite. She is on Diflucan.  4.  For  her chronic systolic congestive heart failure, I do not feel Lasix is indicated in this patient. This may dehydrate her more, but can use on a p.r.n. basis if you needed to. I did not prescribe an ACE inhibitor because her creatinine did go up and her blood pressure is on the lower side just with the Coreg.  5.  For her acute kidney injury, this improved with IV fluid hydration.  6.  For her left upper lung mass, this is likely lung cancer. It did enlarge on chest x-ray. They refused treatment in the past. This will likely be fatal.  7.  Constipation. She is on  medications.  8.  Failure to thrive. The patient did have a calorie count which was poor but she did pick up her appetite this morning and ate quite a bit of her breakfast. If the patient does decompensate, consider hospice home. The patient is a DNR.   TIME SPENT ON DISCHARGE: 40 minutes.  ____________________________ Herschell Dimesichard J. Renae GlossWieting, MD rjw:sb D: 09/25/2012 11:35:13 ET T: 09/25/2012 11:55:32 ET JOB#: [id number]  cc: Herschell Dimesichard J. Renae GlossWieting, MD, <Dictator> Serita ShellerErnest B. Maryellen PileEason, MD Salley ScarletICHARD J Ernesto Lashway MD ELECTRONICALLY SIGNED 10/02/2012 18:36

## 2014-11-22 NOTE — H&P (Signed)
PATIENT NAME:  Crystal Sanders, Crystal Sanders MR#:  [id number] DATE OF BIRTH:  1929/04/22  DATE OF ADMISSION:  09/17/2012  PRIMARY CARE PHYSICIAN: Dr. Maryellen PileEason.  REFERRING PHYSICIAN; Dr. Dolores FrameSung.  CHIEF COMPLAINT: Shortness of breath, hypoxia, fever and cough.   HISTORY OF PRESENT ILLNESS: The patient is an 81 year old African American female with a past medical history of chronic respiratory failure on 3 L of home oxygen, recurrent admissions from COPD exacerbation, congestive heart failure, systolic in nature, with an ejection fraction of 25 to 35% and left upper lobe spiculated mass, likely malignancy, the patient's refused treatment, hypertension, von Willebrand's, anemia and anxiety with DNR CODE STATUS. She was sent over to the ER for shortness of breath, cough and hypoxemia associated a fever as high as 101 degree Fahrenheit. The patient was placed on 4 L of oxygen and chest x-ray has revealed right lung infiltrates. Blood cultures were obtained and the patient has received Zosyn and vancomycin in the ER for pneumonia. As the patient was waiting, Solu-Medrol 125 mg IV was also ordered in the ER. Family members are not available bedside and the patient was quite lethargic, opening her eyes to verbal commands, but falling asleep and I was unable to get any history from the patient. The patient was not using any accessory muscles during my examination.   PAST MEDICAL HISTORY:  1.  Chronic respiratory failure, is on home oxygen.  2.  Recurrent admissions from chronic obstructive pulmonary disease exacerbation, last admission was in March 2013. 3.  Tobacco use disorder.  4.  Erosive gastropathy.  5.  Hypertension.  6.  History of von Willebrand's.  7.  Anemia.  8.  Anxiety.  9.  Congestive heart failure, systolic in nature, with an ejection fraction of 25 to 35%.  10. Left upper lobe spiculated mass, likely malignancy. The patient refused further workup as according to medical records.   PAST SURGICAL HISTORY:  Unobtainable as the patient is lethargic.   ALLERGIES: No known drug allergies.   HOME MEDICATIONS:  Per nursing home: 1.  Xanax 0.25 mg p.o. q. 4 hours. 2.  Tylenol 325 mg 2 tablets p.o. q. 6 hours. 3.  Simethicone 80 mg 2 tablets p.o. q. 6 hours. 4.  Senna 50 mg/80.6 mg, 2 tablets p.o. once a day.  5.  Decadron 2 mg once daily. 6.  Remeron tabs 1.5 mg once daily. 7.  Protonix 40 mg once daily. 8.  Promethazine 20 mg p.o. q. 6 hours. 9.  Morphine 10 mg p.o. q. 2 hours as needed for pain. 10.Coreg 3.125 mg 1 tablet 2 times a day. 11. Lisinopril 5 mg once daily. 12. DuoNeb 3 mL inhalation 4 times a day. 13. Advair 1 puff inhalation 2 times a day. 14. Aspirin 81 mg once daily. 15. Calcium carbonate 2 tablets p.o. every 4 hours. 16. Claritin 10 mg 1 tablet once a day.  PSYCHOSOCIAL HISTORY: Residing in a nursing home. According to the old records, ongoing tobacco abuse. Her CODE STATUS is DNR according to the records from the nursing home.   FAMILY HISTORY: According to the old records, dad deceased at age 79 with unspecified heart disease.   REVIEW OF SYSTEMS: Unobtainable as the patient is very lethargic.   PHYSICAL EXAMINATION:  VITAL SIGNS: Temperature 101 degrees Fahrenheit, pulse 62, respirations 20, blood pressure 101/59 and sating 98% on 4 L.  GENERAL APPEARANCE: Not in acute distress. Healthy built and moderately nourished.  HEENT: Normocephalic, atraumatic. No scleral icterus. No conjunctival injection.  No sinus tenderness. No nasal discharge. Tympanic membranes are intact.  NECK: Supple. No JVD. No thyromegaly.  LUNGS: Distant breath sounds. Diffuse wheezing is present. No accessory muscle use. Positive rhonchi and crackles, more on the right side than on the left side.  CARDIAC: S1, S2 normal. Regular rate and rhythm.  GASTROINTESTINAL: Soft. Bowel sounds are positive in all 4 quadrants. Nontender, nondistended. No masses felt.  NEUROLOGIC: Arousable, but very lethargic  and falling sleep.  SKIN: No rashes. No lesions. No acne. EXTREMITIES: Trace edema is present. No cyanosis. No clubbing. HEMATOLOGIC/LYMPHATIC: No bruising noticed.  PSYCHIATRIC: The patient is tired and lethargic. I was unable to determine her mood and affect.   LABORATORIES AND IMAGING STUDIES: Glucose 100, BUN 27, creatinine 1.1 with sodium 139, potassium 4.8, chloride 105, CO2 29, anion gap 5, GFR 55, serum osmolality 287, calcium 8.7. WBC elevated at 14.5, hemoglobin 10.3, hematocrit 31.9, platelet count 258. Chest x-ray right upper and middle lobe infiltrates.   ASSESSMENT AND PLAN: An 81 year old African American female presented to the Emergency Room from nursing home for shortness of breath, cough, fever and hypoxia. She will be admitted with the following assessment and plan.  1.  Healthcare-associated pneumonia/post obstructive pneumonia. We will get sputum culture and sensitivity. We will start her on intravenous Zosyn, Levaquin and vancomycin.  2.  History of lung mass, probably malignant in nature. The patient refused treatment.  3.  Acute exacerbation of chronic obstructive pulmonary disease. Will provide her Solu-Medrol and continue antibiotics. DuoNeb nebulizer treatments will be given to the patient.  4.  History of systolic congestive heart failure with an ejection fraction of 25 to 35%. Currently, the patient does not seem to be fluid overloaded. Will resume home medications once the patient is more awake and alert after re-evaluating the patient in a.Sanders. by the rounding physician.  5.  Hypertension. Currently, the patient is hypotensive, so we will hold off on the antihypertensives.  6.  Chronic history of  bundle branch block. 7.  Erosive gastropathy. We will put her on gastrointestinal prophylaxis.  8.  Deep vein thrombosis prophylaxis will be provided.  9.  The patient as DO NOT RESUSCITATE.  No family members were available at bedside to discuss about the patient's care. We  will consider calling the patient's family in the a.Sanders.   TOTAL TIME SPENT ON ADMISSION: 60 minutes.   Condition is guarded.   ____________________________ Ramonita Lab, MD ag:aw D: 09/17/2012 04:27:12 ET T: 09/17/2012 12:32:43 ET JOB#: [id number]  cc: Ramonita Lab, MD, <Dictator> Ramonita Lab MD ELECTRONICALLY SIGNED 09/19/2012 6:33

## 2014-11-24 NOTE — H&P (Signed)
PATIENT NAME:  Crystal Sanders, Crystal Sanders MR#:  [id number] DATE OF BIRTH:  1928-10-28  DATE OF ADMISSION:  10/17/2011  PRIMARY CARE PHYSICIAN:  Dr. Maryellen Pile.   CHIEF COMPLAINT: Shortness of breath, hypoxia with sats down in the 80s.   HISTORY OF PRESENT ILLNESS: Crystal Sanders is an 81 year old African American female who is well known to our service from previous admission. Last admission was from 01/28 to 02/05 who comes in today from [facility] with increasing shortness of breath with sats dropped down into the 80s per staff at [facility]. The patient is currently, during my evaluation, on BiPAP. Her ABG looks chronic and pCO2 and paO2 are within normal limits. The patient feels a little bit better, has been wheezing, received some Levaquin and Solu-Medrol along with breathing treatments. She is being admitted for further evaluation and management.   PAST MEDICAL HISTORY:  1. Chronic respiratory failure on home oxygen.  2. Recurrent admissions for chronic obstructive pulmonary disease flare, last one in January 2013.  3. Congestive heart failure with systolic dysfunction, ejection fraction of 25 to 35%.  4. Left upper lobe spiculated mass, likely malignancy. The patient refused further work-up.  5. Tobacco abuse disorder.  6. Erosive gastropathy.  7. Hypertension.  8. History of bundle branch block.  9. Anemia.  10. Anxiety.   CODE STATUS: DO NOT RESUSCITATE.   CURRENT MEDICATIONS:  1. Advair 250/50, one puff twice a day.  2. Coreg 6.25 p.o. b.i.d.  3. Protonix 40 mg daily.  4. Ipratropium albuterol nebulizer every four p.r.n.  5. Lisinopril 5 mg daily.  6. Dexamethasone 2 mg by mouth daily. Usually the patient continues this after her prednisone taper is over.  7. Promethazine 25 mg 1 tablet every six hours p.r.n.  8. Morphine sulfate 0.25 to 0.5 mL which is equivalent to 5 to 10 mg by mouth every two hours as needed.  9. Xanax 1 mg every four hours p.r.n.  10. Qvar 1 grain inhalation by  mouth b.i.d.  11. Combivent 2 puffs b.i.d.  12. Spiriva 1 capsule inhalation daily.  13. Aspirin 81 mg daily.   ALLERGIES: No known drug allergies.   FAMILY HISTORY: Negative for malignancy. Dad died at 84 with unspecified heart disease.   SOCIAL HISTORY: Ongoing tobacco abuse. She is a resident at [facility].   REVIEW OF SYSTEMS: CONSTITUTIONAL: Positive for fatigue, weakness. No fever. EYES: No blurred or double vision. No glaucoma. ENT: No tinnitus, ear pain, or symptoms of sinusitis. RESPIRATORY: Positive for wheezing and chronic obstructive pulmonary disease. No cough. CARDIOVASCULAR: No chest pain, orthopnea, or edema. GASTROINTESTINAL: No nausea, vomiting, diarrhea, or abdominal pain. No gastroesophageal reflux disease. GU: No dysuria or hematuria. ENDOCRINE: No polyuria or nocturia. HEMATOLOGY: Positive for chronic anemia. SKIN: No acne or rash. MUSCULOSKELETAL: Positive for arthritis. NEUROLOGIC: No cerebrovascular accident or transient ischemic attack. PSYCH: Positive for some anxiety. All other systems reviewed and negative.   PHYSICAL EXAMINATION:  GENERAL: The patient is awake, alert, and oriented x3.   VITAL SIGNS: Afebrile, pulse 78, respirations 20, blood pressure 135/81, sats are 100% on current BiPAP setting.   HEENT: The patient is overall thin, frail, poorly nourished.   HEENT: Atraumatic, normocephalic. Pupils are equal, round, and reactive to light and accommodation. Extraocular movements intact. Oral mucosa is dry.   NECK: Supple. No JVD. No carotid bruit.   RESPIRATORY: Some bilateral wheezing present posteriorly. No respiratory distress or use of accessory muscles.   CARDIOVASCULAR: Both the heart sounds are  normal. No tachycardia. No murmur heard. PMI is not lateralized. Chest nontender.   EXTREMITIES: Good pedal pulses, good femoral pulses. No lower extremity edema.   ABDOMEN: Soft, benign, and nontender. No organomegaly. Positive bowel sounds.    NEUROLOGIC: Grossly intact cranial nerves II through XII. No major motor or sensory deficit. The patient is globally weak.   PSYCH: No anxiety at present.   LABORATORY, RADIOLOGICAL AND DIAGNOSTIC DATA: Cardiac enzymes, first set, negative. White count is 11.8, hemoglobin and hematocrit 11.6 and 34.4, platelet count 218, MCV 102. Glucose 116, BUN 29, creatinine 0.78, sodium 135. LFTs are within normal limits. Albumin 3.0. Urinalysis negative for urinary tract infection. Chest x-ray consistent with emphysema. No acute disease of the chest. There is a spiculated 2.5 cm left apical pulmonary nodule concerning for malignancy.   ASSESSMENT: 81 year old Crystal Sanders with:  1. Acute on chronic respiratory failure secondary to chronic obstructive pulmonary disease exacerbation. The patient had CT chest in January 2013 negative for PE, showed increase in size of the mass in the left upper lobe. She is under hospice and DO NOT RESUSCITATE, does not want further evaluation. She was evaluated by Dr. Lorre NickGittin in the past. No further work-up. The patient does not have any fever, cough. Will hold off on antibiotics. We will continue Solu-Medrol, nebulizers, oxygen and her oral inhalers.  2. Acute chronic obstructive pulmonary disease exacerbation. As above.  3. History of atrial fibrillation. Currently, the patient appears to be heart rate controlled in the 70s. Continue aspirin. 2-D echo in June 2012 showed severe systolic dysfunction, ejection fraction of 25 to 35% with mild AR, TR and pulmonary hypertension and mild to moderate MR. Therefore, will not report echo at this time.  4. Spiculated left upper lobe mass concerning for malignancy. Evaluated by Dr. Lorre NickGittin in the past. The patient and family does not want further work-up. Hence, will have no follow-up chest imaging studies.  5. Tobacco abuse, ongoing. Strongly counseled on importance of smoking. Nicotine patch will be given in the hospital.  6. History of  erosive gastropathy. Continue PPI.  7. No family members are present in the Emergency Room. The patient is a DO NOT RESUSCITATE. We will admit her for overnight observation and once improves, can be returned back to [facility]. Care management for discharge planning.   TIME SPENT: 50 minutes.  ____________________________ Wylie HailSona A. Allena KatzPatel, MD sap:ap D: 10/17/2011 08:53:17 ET T: 10/17/2011 11:07:28 ET JOB#: [id number]  cc: Neilan Rizzo A. Allena KatzPatel, MD, <Dictator> Serita ShellerErnest B. Maryellen PileEason, MD Willow OraSONA A Imer Foxworth MD ELECTRONICALLY SIGNED 10/17/2011 13:40

## 2014-11-24 NOTE — Discharge Summary (Signed)
PATIENT NAME:  Crystal Sanders, Crystal Sanders MR#:  [id number] DATE OF BIRTH:  1928/10/16  DATE OF ADMISSION:  10/17/2011 DATE OF DISCHARGE:  10/20/2011  ADMITTING PHYSICIAN: Enedina FinnerSona Patel, MD  DISCHARGING PHYSICIAN: Enid Baasadhika Elanah Osmanovic, MD  PRIMARY CARE PHYSICIAN: Toy CookeyErnest Eason, MD  CONSULTANTS: Ned GraceNancy Phifer, MD - Palliative Care.  DISCHARGE DIAGNOSES:  1. Acute chronic obstructive pulmonary disease exacerbation.  2. Chronic respiratory failure secondary to chronic obstructive pulmonary disease, on home oxygen.  3. Chronic dyspnea and spiculated left upper lobe mass, likely malignant. The patient refused work-up.  4. Chronic systolic congestive heart failure with ejection fraction of 25 to 35%, well compensated.  5. Erosive gastropexy.  6. Past history of tobacco use.  7. Anemia.  8. Anxiety.   DISCHARGE HOME MEDICATIONS:  1. Roxanol 20 mg/mL oral concentrated solution - 0.25 to 0.5 mL every 2 hours p.r.n. for pain or dyspnea.  2. Advair 250/50 one puff twice a day.  3. Combivent inhaler four times daily. 4. Spiriva inhalation capsule 1 capsule daily.  5. Coreg 6.25 mg p.o. twice a day.  6. Protonix 40 mg p.o. daily.  7. Aspirin 81 mg p.o. daily.  8. Xanax 0.25 mg p.o. every four hours p.r.n. anxiety.  9. DuoNebs 0.5 mg-2.5/3 mL inhalation solution every 4 hours p.r.n. for shortness of breath.  10. Lisinopril 5 mg p.o. daily.  11. Promethazine 25 mg every six hours p.r.n.  12. Prednisone taper 60 mg p.o. daily and taper off x 5 mg every day.  13. Decadron 2 mg p.o. daily, to be started after finishing up the prednisone.  DISCHARGE HOME OXYGEN: 2 liters.   DISCHARGE DIET: Low-sodium diet.   DISCHARGE ACTIVITY: As tolerated.   FOLLOWUP INSTRUCTIONS:  1. Follow up with PCP in two weeks.  2. Hospice services.   LABS AND IMAGING STUDIES: WBC 13.4, hemoglobin 10.5, hematocrit 31.5, platelet count 229.   ABG showed pH of 7.42, pCO2 44, pO2 87, bicarbonate 28, saturations 96% on BiPAP, 40% FiO2.    Sodium 135, potassium 4.6, chloride 100, bicarbonate 29, BUN 29, creatinine 0.78, glucose 116, calcium 8.9.   ALT 26, AST 26, alkaline phosphatase 59, total bilirubin 0.4, albumin 3.0. Cardiac enzymes remained negative.   Urinalysis: Negative for infection.   Chest x-ray showed a 2.5 cm left apical spiculated pulmonary nodule concerning for malignancy, which is still noted. Hyperinflated lung fields secondary to chronic obstructive pulmonary disease.  BRIEF HOSPITAL COURSE: Ms. Crystal Sanders is an 81 year old African American female with past medical history significant for chronic respiratory failure secondary to chronic obstructive pulmonary disease, old history of tobacco use, and left upper lobe spiculated nodule likely malignancy and refused work-up in the past who was recently hospitalized for chronic obstructive pulmonary disease exacerbation. She was sent to [facility] and comes back with dyspnea and hypoxia with saturations in the 80s. She was initially placed on BiPAP and was admitted.  1. Acute on chronic respiratory failure secondary to chronic obstructive pulmonary disease exacerbation. No evidence of any bronchitis or pneumonia, so she was not started on any antibiotics. She has been on IV Solu-Medrol and oxygen support while in the hospital. The patient does complain of chronic dyspnea every time she moves in bed, but this is chronic and Roxanol has been helping her symptomatically. Her prognosis is poor with her chronic obstructive pulmonary disease, poor lung reserve and then possible malignant mass in the left upper lobe. She has refused prior oncology work-up and also any biopsy of the mass. Palliative care  was consulted and the patient was followed by hospice in the past and hospice is being reinitiated at the time of discharge and will follow her up at [facility]. She will be on a prednisone taper, inhalers, and nebulizers as needed at discharge. If it is pure chronic dyspnea, she  need not be brought to the hospital and can be symptomatically managed.  2. Congestive heart failure, well compensated. Ejection fraction 25 to 35%. She is on appropriate medications with lisinopril, Coreg, and aspirin.  3. Anxiety. She is on Xanax p.r.n. Her course has been otherwise uneventful in the hospital.  4. Urinary retention. The patient had some urinary retention when she initially came in. Bladder scan showed more than 1000 mL of urine, Foley is being taken out, and she will be discharged after she has a void.  DISCHARGE CONDITION: Stable currently but has guarded prognosis.   DISCHARGE DISPOSITION: [facility] with hospice services.   CODE STATUS: DO NOT RESUSCITATE.  The plan was discussed with the patient and also the son at bedside who agreed.   TIME SPENT ON DISCHARGE:  45 minutes. ____________________________ Enid Baas, MD rk:slb D: 10/20/2011 11:58:27 ET     T: 10/20/2011 12:25:36 ET       JOB#: [id number] cc: Alden Server B. Maryellen Pile, MD Enid Baas MD ELECTRONICALLY SIGNED 10/24/2011 0:07

## 2014-11-24 NOTE — H&P (Signed)
PATIENT NAME:  Crystal Sanders, ASKIN MR#:  [id number] DATE OF BIRTH:  1929/06/03  DATE OF ADMISSION:  08/30/2011  REFERRING PHYSICIAN: Dr. Margarita Grizzle PRIMARY CARE PHYSICIAN: [facility]    CHIEF COMPLAINT: Shortness of breath.   HISTORY OF PRESENT ILLNESS: Patient is a pleasant 81 year old female with past medical history listed below including ongoing tobacco abuse, chronic respiratory failure with oxygen and steroid-dependent chronic obstructive pulmonary disease, history of left upper lobe spiculated lung mass concerning for cancer for which patient has opted for nonaggressive measures. Patient is also DO NOT RESUSCITATE. She has got severe systolic dysfunction with chronic systolic congestive heart failure and cardiomyopathy, ejection fraction 25% to 35% with pulmonary hypertension as well as erosive gastropathy, hypertension, emphysema with chronic obstructive pulmonary disease and ongoing tobacco abuse, history of anemia who presents to the Emergency Department with the above-mentioned chief complaint. Patient states she is chronically short of breath but over the past day shortness of breath was worse since this morning, associated with a dry cough and some wheezing. She states she has been exposed to her son who was sick with a cold. She also reports occasional chest pain intermittently but none today. She denies any pleuritic chest pain currently. She denies any lower extremity swelling or edema. She denies orthopnea. She denies any recent travel. She denies any fevers or chills. She usually uses 3 liters of oxygen at home. She is also using inhalers and nebulizers and despite doing so at home her shortness of breath and wheezing have not significantly improved hence she came to the ER for further evaluation. She had a chest x-ray performed in the ER today which revealed known left upper lobe mass and also probable chronic fibrotic change in the right upper lobe and also mild interstitial infiltrate  could not be excluded. She was quite tachypneic and in moderate respiratory distress in the ER and on her usual 3 liters of oxygen her sats are 88% to 92%. Otherwise, she is without specific complaints at this time. Hospitalist services were contacted for further evaluation and for hospital admission.   PAST SURGICAL HISTORY: None past surgeries.   PAST MEDICAL HISTORY: 1. Hypertension.  2. Chronic respiratory pronation. 3. Oxygen and steroid-dependent chronic obstructive pulmonary disease.  4. Ongoing tobacco abuse.  5. Left upper lobe spiculated lung mass concerning for cancer for which patient has opted for nonaggressive measures and does not want any further work-up or management of this condition, was seen by Dr. Lorre Nick in the recent past.  6. Hyperlipidemia. 7. History of syncope.  8. History of PVC.  9. History of bundle branch block. 10. History of macrocytic anemia.  11. History of erosive gastropathy.  12. History of cachexia and malnutrition.  13. History of chronic systolic congestive heart failure with severe systolic dysfunction, mild diastolic dysfunction, ejection fraction 25% to 35% with mild hypertension, mild AR and mild TR as well as mild to moderate mitral regurgitation noted on echocardiogram June 2012.  14. Chest pain and hospitalization 01/04/2011. Patient had a negative Myoview 01/05/2011. Had abnormal echocardiogram as above with severe systolic dysfunction, ejection fraction 25% to 35%.  15. She was also admitted with chronic obstructive pulmonary disease exacerbation and acute bronchitis. Was noted to have a left upper lobe spiculated lung mass, was seen by pulmonology and oncology as well as palliative care and she had opted for nonaggressive measures at that time. CODE STATUS was discussed with her at that time and she had opted for DO NOT RESUSCITATE  status which she confirms again today.  16. Tobacco abuse, ongoing.  17. DO NOT RESUSCITATE. 18. Occasional  anxiety.   ALLERGIES: No known drug allergies.    HOME MEDICATIONS: 1. Oxygen at 3 liters/min via nasal cannula continuously. 2. Albuterol nebulizers q.4 hours while awake. 3. Ativan 0.5 mg p.o. every four hours p.r.n.  4. Lasix 20 mg p.r.n.  5. Tylenol suppository p.r.n.  6. ABHR suppository p.r.n.  7. Compazine suppository p.r.n.  8. Atropine 0.1% two drops under the tongue as needed for increased secretions.  9. Dexamethasone 2 mg p.o. daily.  10. Haldol 1 mg p.r.n. agitation.  11. Albuterol metered dose inhaler p.r.n.  12. Spiriva HandiHaler one cap inhaled daily. 13. Q-var 40 mcg 1 puff inhaled b.i.d.  14. Lisinopril 2.5 mg daily. 15. Xanax 0.25 mg 1 to 2 tablets p.o. every 4 to 6 hours p.r.n.   FAMILY HISTORY: Negative for malignancy. Dad died at age 51 with unspecified heart disease.   SOCIAL HISTORY: Tobacco-Ongoing, currently smokes half pack per week, in the past she smoked 1 to 2 packs per day for several decades. She continues to smoke now as well. She states she is interested in quitting and currently desires a nicotine patch. Alcohol-None. Illicit drugs-None. Patient lives at home with her granddaughter and two great-grandchildren. She has one daughter who accompanies her today, Jeannine Pennisi and son also accompanies her today, Fleta Borgeson. Patient has six children total.   REVIEW OF SYSTEMS: CONSTITUTIONAL: Denies fevers, chills, or weakness. Denies any pain at the moment. Has had some weight loss recently, unable to quantify amount or duration. HEAD/EYES: Denies headache or blurry vision. ENT: Denies tinnitus, earache, nasal discharge, or sore throat. RESPIRATORY: Reports shortness of breath, cough, and wheezing. Denies hemoptysis. CARDIOVASCULAR: Denies chest pain, heart palpitations or lower extremity edema currently. GASTROINTESTINAL: Denies nausea, vomiting, diarrhea, constipation, melena, hematochezia, abdominal pain. GENITOURINARY: Denies dysuria, hematuria. ENDOCRINE:  Denies heat or cold intolerance. HEME/LYMPH: Denies easy bruising or bleeding. INTEGUMENTARY: Denies rash or any lesions. MUSCULOSKELETAL: Denies joint pain or back pain or muscle weakness currently. NEUROLOGIC: Denies headache, numbness, weakness, tingling, or dysarthria. PSYCHIATRIC: Denies depression. Has occasional anxiety.    PHYSICAL EXAMINATION:  VITAL SIGNS: Temperature 98.9, pulse 90, blood pressure 145/72, respirations 28 to 32, oxygen saturation 88% to 92% on 3 liters nasal cannula and she uses 3 liters nasal cannula at baseline.    GENERAL: Patient in moderate respiratory distress, speaking in broken sentences. She is tachypneic and using her accessory respiratory muscles to breathe. She is alert and oriented.   HEENT: Normocephalic, atraumatic. Pupils are equal, round and reactive to light accommodation. Extraocular muscles are intact. Anicteric sclerae. Conjunctivae pink. Hearing intact to voice. Nares without drainage. Oral mucosa moist without lesions.   NECK: Supple with full range of motion. No jugular venous distention, lymphadenopathy, or carotid bruits bilaterally. No thyromegaly or tenderness to palpation over thyroid gland.    CHEST: Increased respiratory effort. Patient is tachypneic and using her accessory respiratory muscles to breathe. She has got decreased and tight breath sounds bilaterally with hyperresonance to percussion and she has got bilateral wheezing, right greater than left. No crackles or rales. She has got occasional rhonchi.   CARDIOVASCULAR: Distant heart sounds but irregularly irregular. No murmurs, rubs, or gallops. PMI is non-lateralized. No rubs or gallops or murmurs.   ABDOMEN: Soft, nontender, nondistended. Normoactive bowel sounds. No hepatosplenomegaly or palpable masses. No hernias.   EXTREMITIES: No clubbing, cyanosis, or edema. Pedal pulses are palpable bilaterally.  SKIN: No suspicious rashes. Skin turgor is good.   NECK: No cervical  lymphadenopathy.   NEUROLOGIC: Alert, oriented x3. Cranial nerves II through XII grossly intact. No focal deficits.   PSYCH: Pleasant female with appropriate affect.   LABORATORY, DIAGNOSTIC AND RADIOLOGICAL DATA: Portable chest x-ray reveals left upper lobe mass paramediastinal region just above level of the aortic arch. There is probable chronic fibrotic changes right upper lobe. Mild interstitial infiltrate is not excluded.   BMP 210.   CK 246, CK-MB 6.1, troponin less than 0.02.   Complete metabolic panel within normal limits except for AST slightly elevated at 40 and BUN slightly elevated at 19. Creatinine is normal at 0.87.   CBC: WBC 11.9, hemoglobin 12.6, hematocrit 37.8, platelets 225, MCV 104.   EKG: Atrial fibrillation, heart rate 72 beats per minute with PVCs, normal axis. Atrial fibrillation is now new compared with 01/04/2011 and 11/25/2010 EKG both of which revealed normal sinus rhythm and no previous EKG in our system in the past were noted to have atrial fibrillation therefore at this time her atrial fibrillation is felt to be of new onset.   ASSESSMENT AND PLAN: 81 year old female with extensive past medical history including chronic respiratory failure with oxygen and steroid-dependent chronic obstructive pulmonary disease and ongoing tobacco abuse, history of left upper lobe spiculated lung mass suspicious for cancer (who has opted for nonaggressive measures), history of severe systolic dysfunction with chronic systolic congestive heart failure and cardiomyopathy, ejection fraction 25% to 35%, history of hypertension, hyperlipidemia, anemia, erosive gastropathy, DO NOT RESUSCITATE status here with chronic shortness of breath which is acutely worsened over one day associated with dry cough and wheezing with:  1. Acute on chronic hypoxic respiratory failure-Patient is hypoxic with sats as low as 88% documented in the ER on her usual/baseline 3 liters of oxygen via nasal cannula.  Suspect she has acute on chronic respiratory failure secondary to acute bronchitis versus early pneumonia leading to acute chronic obstructive pulmonary disease exacerbation. Recommend hospital admission for further evaluation and management. Will manage as below and please see below for further details. Of note, CT of the chest with PE protocol has been ordered in the ER and is still pending and will be followed but currently she denies any pleuritic chest pain. Will also check influenza A and B antigens. Please see below for further details.  2. Acute chronic obstructive pulmonary disease exacerbation-Suspect secondary to acute bronchitis versus early pneumonia as mild bilateral infiltrates cannot be excluded on chest x-ray. Will await CT to get better assessment of patient's lung parenchyma to assess for possible pneumonia. She has a known left upper lobe mass and wishes for no further work-up or management of this condition and was previously seen by Dr. Lorre NickGittin. For acute chronic obstructive pulmonary disease exacerbation will keep her on supplemental oxygen and increase her oxygen requirements via nasal cannula and titrate to keep her oxygen saturations greater than 90%. Start IV steroids. Will get blood cultures and start IV antibiotics in the event of possible pneumonia and will start Rocephin and azithromycin. Provide bronchodilator support with scheduled DuoNebs and also p.r.n. as needed and also give p.r.n. albuterol metered-dose inhaler. Will continue Spiriva. Our hospital does not have Q-var or beclomethasone inhaler so will write for Advair instead and patient is in agreement with this plan. Further work-up and management to follow depending on patient's clinical course.  3. Bilateral pneumonia (early) versus acute bronchitis-With chest x-ray findings as above and will await chest CT  for better assessment of patient's lung parenchyma in regards to possible pneumonia. As above, blood cultures will be  obtained. Will empirically start patient on IV antibiotics and monitor clinical response and manage her chronic obstructive pulmonary disease exacerbation as above.  4. New onset atrial fibrillation-Her heart rate is currently spontaneously controlled. Would consider calcium channel blocker to keep her heart rate controlled as nebulizer treatments and bronchodilators may lead to tachycardia but would avoid beta blockers due to risk of bronchospasm. Patient does have a high CHADS2 score of 3 and should ideally be on anticoagulation but may not be the best long-term anticoagulation candidate and for now will start her on aspirin for cerebrovascular accident prophylaxis and defer further decisions regarding anticoagulation to cardiology and obtain formal cardiology evaluation and patient is in agreement with this plan as are her son and daughter who are present today in the ER. Will monitor patient on off unit telemetry. Of note, 2-D echocardiogram June 2012 revealed severe systolic dysfunction, mild diastolic dysfunction, ejection fraction 25% to 35%, mild aortic regurgitation, tricuspid regurgitation and pulmonary hypertension, mild to moderate mitral regurgitation therefore will not repeat echocardiogram at this time. Will cycle patient's cardiac enzymes and her first troponin is negative and CK and CK-MB levels are slightly elevated and this could be from increased work of breathing but will continue to cycle her cardiac enzymes for now and she currently denies any chest pain. Await further recommendations from cardiology.  5. Spiculated left upper lobe mass concerning for malignancy-Patient has opted for nonaggressive measures and does not wish for any further work-up or specific treatment for this condition and she was seen by Dr. Lorre Nick in the recent past who recommended no chemotherapy and no follow-up chest imaging therefore will not obtain oncology evaluation at this time and patient is agreeable to this  plan at this time.  6. Chronic systolic and diastolic congestive heart failure-Currently well compensated. She appears euvolemic and not volume overloaded and her BMP is only 210. Patient's blood pressure was 145/72 on arrival but currently 107/70 on my exam therefore will hold her Lasix and ACE inhibitor for now as she is mildly hypotensive (but asymptomatic). She will be seen by cardiology and will await formal consultation for further recommendations. 7. Leukocytosis-Suspect this is steroid-induced as she takes dexamethasone at home at baseline on a daily basis. She is currently afebrile. Blood cultures have been drawn in the ER in the event of possible pneumonia and will be followed. She will be both on IV steroids as well as IV antibiotics and will need to follow her WBC count closely while on both of these agents and will repeat WBC count in the a.m.  8. Tobacco abuse-Ongoing. Patient was strongly counseled on the importance of smoking cessation and approximately five minutes were spent in doing so and will place nicotine patch per patient's desire at this time and she does seem motivated to quit.  9. History of hypertension-As above, blood pressure was 145/72 on arrival and currently 107/70; however, she is asymptomatic and denies any dizziness or lightheadedness or weakness. Will hold her lisinopril and Lasix for now and hopefully her blood pressure will spontaneously improve (but if not will have to hydrate patient very gently and cautiously in light of her low ejection fraction).  10. History of erosive gastropathy-Will start proton pump inhibitor therapy especially while she will be on steroids to help prevent steroid-induced gastric ulcer.  11. DVT prophylaxis-Lovenox.   12. CODE STATUS: DO NOT RESUSCITATE. Patient  confirms her DO NOT RESUSCITATE status which was established in 01/08/2011. (Please refer to palliative care inpatient note which is in our system.) I again had a discussion regarding  CODE STATUS with the patient in the ER (with her daughter Yahaira Bruski and son Siren Porrata both present) and patient again confirms her DO NOT RESUSCITATE status.   TIME SPENT ON THIS ADMISSION: Approximately 65 minutes.  ____________________________ Elon Alas, MD knl:cms D: 08/30/2011 16:41:43 ET T: 08/30/2011 17:20:35 ET JOB#: [id number]  cc: Elon Alas, MD, <Dictator> [facility] [address] Edita Weyenberg MD ELECTRONICALLY SIGNED 09/19/2011 3:25

## 2014-11-24 NOTE — Consult Note (Signed)
Brief Consult Note: Diagnosis: Pt admitted with shortness of breath. Noted to be in afib with rvr. Now with rate control.   Patient was seen by consultant.   Recommend further assessment or treatment.   Comments: 81 yo female with history of hypertension, copd, cardiomyopathy with ef of 25-35% admitted after presenting with shortness of breaht. Noted to be in afib with rapid vengtriuclar response. Ruled out for an mi. Rate is in good control. CHADS-Vasc score is 4 Relatively high risk for anticoagulation at present given pneumonia and antibiotics. Would contirnue with rte control at present and careful hydration as needed. Will defer decision regarding chronic anticoagualtion to out patient follow up. Full note to follow.  Electronic Signatures: Dalia HeadingFath, Jermel Artley A (MD)  (Signed 29-Jan-13 21:10)  Authored: Brief Consult Note   Last Updated: 29-Jan-13 21:10 by Dalia HeadingFath, Roniya Tetro A (MD)

## 2014-11-24 NOTE — Discharge Summary (Signed)
PATIENT NAME:  Crystal Sanders, Crystal Sanders MR#:  [id number] DATE OF BIRTH:  [birth year]  DATE OF ADMISSION:  08/30/2011 DATE OF DISCHARGE:  09/07/2011  ADMITTING PHYSICIAN: Fredia SorrowAbhinav Gupta, MD   DISCHARGING PHYSICIAN: Enid Baasadhika Caterra Ostroff, MD  [facility]IC[facility]   CONSULTANT: Harold HedgeKenneth Fath, MD - Cardiology.  DISCHARGE DIAGNOSES:   1. Acute on chronic respiratory failure.  2. Chronic obstructive pulmonary disease exacerbation.  3. Acute bronchitis.  4. Left upper lobe spiculated mass, likely malignancy. The patient refused further workup.  5. Congestive heart failure with systolic dysfunction, ejection fraction 25 to 35%.  6. Erosive gastropathy.  7. Tobacco use disorder.  8. Hypertension. 9. History of syncope and bundle branch block.  10. Anemia.  11. Anxiety.   DISCHARGE MEDICATIONS:  1. Roxanol 20 mg/mL, 0.25 to 0.5 mL every 2 hours p.r.n. sublingual or orally for pain.  2. Advair 250/50 one puff twice a day. 3. Q-Var aerosol 40 mcg inhalation 2 puffs inhaled twice a day. 4. Combivent inhaler 2 puffs every 6 hours as needed. 5. Spiriva 18 mcg inhaler daily.  6. Coreg 6.25 mg p.o. twice a day. 7. Protonix 40 mg p.o. daily.  8. Aspirin 81 mg p.o. daily.  9. Xanax 0.25 mg p.o. every 4 hours p.r.n. for anxiety.  10. DuoNebs every 4 hours p.r.n. with albuterol and Atrovent, 3 mL. 11. Augmentin 875 mg p.o. twice a day until 09/12/2011.  12. Prednisone taper as prescribed.  13. Decadron 2 mg p.o. daily to be started after finishing up the prednisone taper.  14. Lisinopril 5 mg p.o. daily.   DISCHARGE HOME OXYGEN: 2 liters.   DISCHARGE DIET: Low sodium diet.   DISCHARGE ACTIVITY:  1. Physical therapy.  2. Hospice evaluation once done with rehab.   DISCHARGE FOLLOWUP[facility]   LABS/IMAGING STUDIES: WBC 10.8, hemoglobin 10.9, hematocrit 32.8, platelet count 207, MCV 104.  Sodium 139, potassium 4.6, chloride 103, bicarbonate 25, BUN 25, creatinine 0.93, glucose 146, calcium 8.3. CK 204, CK-MB 6.0, troponin less than 0.02.   Urinalysis was negative for any infection.   Chest x-ray on admission showed left upper lobe mass and chronic fibrotic changes in the right upper lobe. Mild interstitial infiltrate cannot be excluded.   Influenza antigens are negative.   Blood cultures have remained negative.  CT of the chest with contrast showed increase in size of left upper lobe spiculated nodule concerning for neoplastic disease. No evidence of pulmonary embolism. Severe emphysematous changes are seen.   BRIEF HOSPITAL COURSE: Crystal Sanders is an 81 year old African American female with past medical history significant for ongoing smoking and chronic respiratory failure secondary to chronic obstructive pulmonary disease home oxygen dependent and chronic steroid dependent who has a known history of left upper lobe spiculated mass, followed by Dr. Lorre NickGittin, and the patient has opted for nonaggressive measures. She presented to the Emergency Room complaining of dyspnea on exertion. She is on 3 liters home oxygen, but despite using nebulizers and increasing the oxygen she was still dyspneic and wheezing. She was found to be in acute chronic obstructive pulmonary disease exacerbation and hypoxic on 3 liters possibly secondary to bronchitis.  1. Acute on chronic respiratory failure secondary to chronic obstructive pulmonary disease exacerbation and acute bronchitis. She was admitted and placed on oxygen support, nebulizer treatments, and IV Solu-Medrol was given. Her Spiriva, Q-Var, and Advair were continued along with DuoNeb treatments while  in the hospital. CT of the chest did not show any PE, but it did show increase in size of the left upper lobe spiculated mass. Her breathing has improved a lot. She is comfortable at rest on 2 to 3 liters oxygen, but she gets very dyspneic on moving  around which probably is from her increasing lung cancer. She is on a p.o. prednisone taper, as there is no wheeze on examination, and she will continue her inhalers. She was placed on Rocephin and azithromycin for her bronchitis, and it is being changed to Augmentin at the time of discharge. Her influenza test has been negative.  2. Left upper lobe spiculated mass concerning for malignancy. She was seen by Dr. Lorre Nick in the recent past and she opted for nonaggressive measures. She continues to be adamant about her decision of not getting treated for that. The patient will be going to short-term rehab now and then will be evaluated for hospice after discharge from the rehab.  3. Chronic systolic congestive heart failure, well compensated. She does not appear fluid overloaded while in the hospital, but her Lasix was stopped secondary to fear of dehydration. She is on low dose Coreg and also low dose lisinopril. With her low ejection fraction, her systolic blood pressure is mostly around 100 to 110, so please kindly check her blood pressure prior to giving her medications.  4. Xanax was started for her anxiety issues. Her course has been otherwise uneventful while in the hospital. Physical therapy evaluation was done and recommended skilled nursing facility for her.   DISCHARGE CONDITION: Guarded with poor prognosis secondary to lung cancer.   DISCHARGE DISPOSITION: Skilled nursing facility.           CODE STATUS: DO NOT RESUSCITATE.  TIME SPENT ON DISCHARGE: 45 minutes.  ____________________________ Enid Baas, MD rk:slb D: 09/07/2011 12:17:00 ET T: 09/07/2011 12:31:49 ET JOB#: [id number]  cc: [facility] Enid Baas, MD, <Dictator> Enid Baas MD ELECTRONICALLY SIGNED 09/07/2011 13:39
